# Patient Record
Sex: Female | Born: 1995
Health system: Southern US, Community
[De-identification: ages and names within clinical notes are randomized; demographics above are authoritative.]

## PROBLEM LIST (undated history)

## (undated) DIAGNOSIS — R51 Headache: Secondary | ICD-10-CM

## (undated) DIAGNOSIS — T7840XA Allergy, unspecified, initial encounter: Secondary | ICD-10-CM

## (undated) DIAGNOSIS — L309 Dermatitis, unspecified: Secondary | ICD-10-CM

## (undated) DIAGNOSIS — F319 Bipolar disorder, unspecified: Secondary | ICD-10-CM

## (undated) DIAGNOSIS — J45909 Unspecified asthma, uncomplicated: Secondary | ICD-10-CM

## (undated) HISTORY — DX: Headache: R51

## (undated) HISTORY — DX: Bipolar disorder, unspecified: F31.9

## (undated) HISTORY — DX: Allergy, unspecified, initial encounter: T78.40XA

## (undated) HISTORY — DX: Unspecified asthma, uncomplicated: J45.909

## (undated) HISTORY — PX: WISDOM TOOTH EXTRACTION: SHX21

## (undated) HISTORY — DX: Dermatitis, unspecified: L30.9

---
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
## 2017-01-01 ENCOUNTER — Ambulatory Visit (INDEPENDENT_AMBULATORY_CARE_PROVIDER_SITE_OTHER): Payer: 59

## 2017-01-01 DIAGNOSIS — J309 Allergic rhinitis, unspecified: Secondary | ICD-10-CM

## 2017-01-02 ENCOUNTER — Encounter: Payer: Self-pay | Admitting: *Deleted

## 2017-01-03 ENCOUNTER — Telehealth: Payer: Self-pay | Admitting: *Deleted

## 2017-01-03 NOTE — Telephone Encounter (Signed)
Pt mom called really upset because she got charged from a spiro when they told the doctor that "she didn't want it and now she has a high fee for something that she didn't even want done". shes also upset because for a "15 minute visit that was basically pointless, she now has a bill of $164.68". She would like a call back before 5 o clock today.

## 2017-01-03 NOTE — Telephone Encounter (Signed)
Tried to call mom back, but it was a place of business voicemail so I did not leave a message - will try again tomorrow

## 2017-01-04 MED FILL — LEVOCETIRIZINE 5 MG TABLET: 5 | 30 days supply | Qty: 30 | Fill #1

## 2017-01-04 MED FILL — OLOPATADINE HCL 0.2% EYE DR: 0.2 | 30 days supply | Qty: 3 | Fill #1

## 2017-01-04 NOTE — Telephone Encounter (Signed)
Called Mom & read Johnette's explanation to her - she will call insurance about deductible - she did not think she had a deductible - she still does not think Dr. Nunzio CobbsBobbitt should have done the breathing test but she will pay with [employer] card - kt

## 2017-01-04 NOTE — Telephone Encounter (Signed)
Sent note to Johnette asking the best way to handle this - kt

## 2017-01-07 ENCOUNTER — Ambulatory Visit (INDEPENDENT_AMBULATORY_CARE_PROVIDER_SITE_OTHER): Payer: 59

## 2017-01-07 DIAGNOSIS — J309 Allergic rhinitis, unspecified: Secondary | ICD-10-CM | POA: Diagnosis not present

## 2017-01-10 ENCOUNTER — Ambulatory Visit (INDEPENDENT_AMBULATORY_CARE_PROVIDER_SITE_OTHER): Payer: 59

## 2017-01-10 DIAGNOSIS — J309 Allergic rhinitis, unspecified: Secondary | ICD-10-CM | POA: Diagnosis not present

## 2017-01-14 ENCOUNTER — Ambulatory Visit (INDEPENDENT_AMBULATORY_CARE_PROVIDER_SITE_OTHER): Payer: 59

## 2017-01-14 DIAGNOSIS — J309 Allergic rhinitis, unspecified: Secondary | ICD-10-CM | POA: Diagnosis not present

## 2017-01-18 ENCOUNTER — Ambulatory Visit (INDEPENDENT_AMBULATORY_CARE_PROVIDER_SITE_OTHER): Payer: 59

## 2017-01-18 DIAGNOSIS — J309 Allergic rhinitis, unspecified: Secondary | ICD-10-CM | POA: Diagnosis not present

## 2017-01-22 ENCOUNTER — Ambulatory Visit (INDEPENDENT_AMBULATORY_CARE_PROVIDER_SITE_OTHER): Payer: 59

## 2017-01-22 DIAGNOSIS — J309 Allergic rhinitis, unspecified: Secondary | ICD-10-CM

## 2017-01-25 ENCOUNTER — Ambulatory Visit (INDEPENDENT_AMBULATORY_CARE_PROVIDER_SITE_OTHER): Payer: 59 | Admitting: *Deleted

## 2017-01-25 DIAGNOSIS — J309 Allergic rhinitis, unspecified: Secondary | ICD-10-CM

## 2017-01-30 ENCOUNTER — Ambulatory Visit (INDEPENDENT_AMBULATORY_CARE_PROVIDER_SITE_OTHER): Payer: 59

## 2017-01-30 DIAGNOSIS — J309 Allergic rhinitis, unspecified: Secondary | ICD-10-CM | POA: Diagnosis not present

## 2017-02-04 ENCOUNTER — Ambulatory Visit (INDEPENDENT_AMBULATORY_CARE_PROVIDER_SITE_OTHER): Payer: 59

## 2017-02-04 DIAGNOSIS — J309 Allergic rhinitis, unspecified: Secondary | ICD-10-CM | POA: Diagnosis not present

## 2017-02-04 MED FILL — LEVOCETIRIZINE 5 MG TABLET: 5 | 30 days supply | Qty: 30 | Fill #2

## 2017-02-13 ENCOUNTER — Ambulatory Visit (INDEPENDENT_AMBULATORY_CARE_PROVIDER_SITE_OTHER): Payer: 59

## 2017-02-13 DIAGNOSIS — J309 Allergic rhinitis, unspecified: Secondary | ICD-10-CM | POA: Diagnosis not present

## 2017-02-18 ENCOUNTER — Ambulatory Visit (INDEPENDENT_AMBULATORY_CARE_PROVIDER_SITE_OTHER): Payer: 59 | Admitting: *Deleted

## 2017-02-18 DIAGNOSIS — J309 Allergic rhinitis, unspecified: Secondary | ICD-10-CM

## 2017-02-21 ENCOUNTER — Ambulatory Visit (INDEPENDENT_AMBULATORY_CARE_PROVIDER_SITE_OTHER): Payer: 59 | Admitting: *Deleted

## 2017-02-21 DIAGNOSIS — J309 Allergic rhinitis, unspecified: Secondary | ICD-10-CM

## 2017-02-26 ENCOUNTER — Ambulatory Visit (INDEPENDENT_AMBULATORY_CARE_PROVIDER_SITE_OTHER): Payer: 59 | Admitting: *Deleted

## 2017-02-26 DIAGNOSIS — J309 Allergic rhinitis, unspecified: Secondary | ICD-10-CM | POA: Diagnosis not present

## 2017-03-07 ENCOUNTER — Ambulatory Visit (INDEPENDENT_AMBULATORY_CARE_PROVIDER_SITE_OTHER): Payer: 59

## 2017-03-07 DIAGNOSIS — J309 Allergic rhinitis, unspecified: Secondary | ICD-10-CM | POA: Diagnosis not present

## 2017-03-14 ENCOUNTER — Ambulatory Visit (INDEPENDENT_AMBULATORY_CARE_PROVIDER_SITE_OTHER): Payer: 59

## 2017-03-14 DIAGNOSIS — J309 Allergic rhinitis, unspecified: Secondary | ICD-10-CM | POA: Diagnosis not present

## 2017-03-19 ENCOUNTER — Ambulatory Visit (INDEPENDENT_AMBULATORY_CARE_PROVIDER_SITE_OTHER): Payer: 59 | Admitting: *Deleted

## 2017-03-19 DIAGNOSIS — J309 Allergic rhinitis, unspecified: Secondary | ICD-10-CM | POA: Diagnosis not present

## 2017-03-21 MED FILL — OLOPATADINE HCL 0.2 % SOLN: 0.2 | 30 days supply | Qty: 3 | Fill #2

## 2017-03-21 MED FILL — LEVOCETIRIZINE 5 MG TABLET: 5 | 30 days supply | Qty: 30 | Fill #3

## 2017-03-22 ENCOUNTER — Ambulatory Visit (INDEPENDENT_AMBULATORY_CARE_PROVIDER_SITE_OTHER): Payer: 59

## 2017-03-22 DIAGNOSIS — J309 Allergic rhinitis, unspecified: Secondary | ICD-10-CM

## 2017-03-28 ENCOUNTER — Ambulatory Visit (INDEPENDENT_AMBULATORY_CARE_PROVIDER_SITE_OTHER): Payer: 59

## 2017-03-28 DIAGNOSIS — J309 Allergic rhinitis, unspecified: Secondary | ICD-10-CM

## 2017-04-02 ENCOUNTER — Ambulatory Visit (INDEPENDENT_AMBULATORY_CARE_PROVIDER_SITE_OTHER): Payer: 59

## 2017-04-02 DIAGNOSIS — J309 Allergic rhinitis, unspecified: Secondary | ICD-10-CM

## 2017-04-10 ENCOUNTER — Ambulatory Visit (INDEPENDENT_AMBULATORY_CARE_PROVIDER_SITE_OTHER): Payer: 59

## 2017-04-10 DIAGNOSIS — J309 Allergic rhinitis, unspecified: Secondary | ICD-10-CM | POA: Diagnosis not present

## 2017-04-18 ENCOUNTER — Ambulatory Visit (INDEPENDENT_AMBULATORY_CARE_PROVIDER_SITE_OTHER): Payer: 59

## 2017-04-18 DIAGNOSIS — J309 Allergic rhinitis, unspecified: Secondary | ICD-10-CM | POA: Diagnosis not present

## 2017-04-23 ENCOUNTER — Ambulatory Visit (INDEPENDENT_AMBULATORY_CARE_PROVIDER_SITE_OTHER): Payer: 59

## 2017-04-23 DIAGNOSIS — J309 Allergic rhinitis, unspecified: Secondary | ICD-10-CM

## 2017-04-26 ENCOUNTER — Ambulatory Visit (INDEPENDENT_AMBULATORY_CARE_PROVIDER_SITE_OTHER): Payer: 59 | Admitting: *Deleted

## 2017-04-26 DIAGNOSIS — J309 Allergic rhinitis, unspecified: Secondary | ICD-10-CM | POA: Diagnosis not present

## 2017-04-30 ENCOUNTER — Ambulatory Visit (INDEPENDENT_AMBULATORY_CARE_PROVIDER_SITE_OTHER): Payer: 59

## 2017-04-30 DIAGNOSIS — J309 Allergic rhinitis, unspecified: Secondary | ICD-10-CM | POA: Diagnosis not present

## 2017-05-03 MED FILL — LEVOCETIRIZINE 5 MG TABLET: 5 | 30 days supply | Qty: 30 | Fill #4

## 2017-05-06 ENCOUNTER — Ambulatory Visit (INDEPENDENT_AMBULATORY_CARE_PROVIDER_SITE_OTHER): Payer: 59

## 2017-05-06 DIAGNOSIS — J309 Allergic rhinitis, unspecified: Secondary | ICD-10-CM

## 2017-05-09 ENCOUNTER — Ambulatory Visit (INDEPENDENT_AMBULATORY_CARE_PROVIDER_SITE_OTHER): Payer: 59

## 2017-05-09 DIAGNOSIS — J309 Allergic rhinitis, unspecified: Secondary | ICD-10-CM | POA: Diagnosis not present

## 2017-05-14 ENCOUNTER — Ambulatory Visit (INDEPENDENT_AMBULATORY_CARE_PROVIDER_SITE_OTHER): Payer: 59

## 2017-05-14 DIAGNOSIS — J309 Allergic rhinitis, unspecified: Secondary | ICD-10-CM

## 2017-05-17 ENCOUNTER — Ambulatory Visit (INDEPENDENT_AMBULATORY_CARE_PROVIDER_SITE_OTHER): Payer: 59

## 2017-05-17 DIAGNOSIS — J309 Allergic rhinitis, unspecified: Secondary | ICD-10-CM

## 2017-05-21 ENCOUNTER — Ambulatory Visit (INDEPENDENT_AMBULATORY_CARE_PROVIDER_SITE_OTHER): Payer: 59

## 2017-05-21 DIAGNOSIS — J309 Allergic rhinitis, unspecified: Secondary | ICD-10-CM | POA: Diagnosis not present

## 2017-05-24 ENCOUNTER — Ambulatory Visit (INDEPENDENT_AMBULATORY_CARE_PROVIDER_SITE_OTHER): Payer: 59

## 2017-05-24 DIAGNOSIS — J309 Allergic rhinitis, unspecified: Secondary | ICD-10-CM

## 2017-05-30 ENCOUNTER — Ambulatory Visit (INDEPENDENT_AMBULATORY_CARE_PROVIDER_SITE_OTHER): Payer: 59

## 2017-05-30 DIAGNOSIS — J309 Allergic rhinitis, unspecified: Secondary | ICD-10-CM | POA: Diagnosis not present

## 2017-06-11 ENCOUNTER — Ambulatory Visit (INDEPENDENT_AMBULATORY_CARE_PROVIDER_SITE_OTHER): Payer: 59

## 2017-06-11 DIAGNOSIS — J309 Allergic rhinitis, unspecified: Secondary | ICD-10-CM

## 2017-06-13 MED FILL — LEVOCETIRIZINE 5 MG TABLET: 5 | 30 days supply | Qty: 30 | Fill #5

## 2017-06-20 ENCOUNTER — Ambulatory Visit (INDEPENDENT_AMBULATORY_CARE_PROVIDER_SITE_OTHER): Payer: 59

## 2017-06-20 DIAGNOSIS — J309 Allergic rhinitis, unspecified: Secondary | ICD-10-CM

## 2017-07-01 ENCOUNTER — Other Ambulatory Visit: Payer: Self-pay

## 2017-07-01 ENCOUNTER — Ambulatory Visit (INDEPENDENT_AMBULATORY_CARE_PROVIDER_SITE_OTHER): Payer: 59

## 2017-07-01 DIAGNOSIS — J302 Other seasonal allergic rhinitis: Secondary | ICD-10-CM

## 2017-07-01 DIAGNOSIS — J309 Allergic rhinitis, unspecified: Secondary | ICD-10-CM | POA: Diagnosis not present

## 2017-07-01 DIAGNOSIS — H1010 Acute atopic conjunctivitis, unspecified eye: Secondary | ICD-10-CM

## 2017-07-01 MED ORDER — LEVOCETIRIZINE DIHYDROCHLORIDE 5 MG PO TABS: 5.0000 mg | ORAL_TABLET | Freq: Every evening | ORAL | 0 refills | Status: DC

## 2017-07-01 NOTE — Telephone Encounter (Signed)
RF on Xyzal x 1 with no refills, pt needs an OV soon

## 2017-07-11 ENCOUNTER — Ambulatory Visit (INDEPENDENT_AMBULATORY_CARE_PROVIDER_SITE_OTHER): Payer: 59

## 2017-07-11 DIAGNOSIS — J309 Allergic rhinitis, unspecified: Secondary | ICD-10-CM | POA: Diagnosis not present

## 2017-07-16 MED FILL — LEVOCETIRIZINE 5 MG TABLET: 5 | 30 days supply | Qty: 30 | Fill #0

## 2017-07-25 ENCOUNTER — Ambulatory Visit (INDEPENDENT_AMBULATORY_CARE_PROVIDER_SITE_OTHER): Payer: 59

## 2017-07-25 DIAGNOSIS — J309 Allergic rhinitis, unspecified: Secondary | ICD-10-CM

## 2017-08-01 ENCOUNTER — Ambulatory Visit (INDEPENDENT_AMBULATORY_CARE_PROVIDER_SITE_OTHER): Payer: 59

## 2017-08-01 DIAGNOSIS — J309 Allergic rhinitis, unspecified: Secondary | ICD-10-CM | POA: Diagnosis not present

## 2017-08-08 ENCOUNTER — Ambulatory Visit (INDEPENDENT_AMBULATORY_CARE_PROVIDER_SITE_OTHER): Payer: 59

## 2017-08-08 DIAGNOSIS — J309 Allergic rhinitis, unspecified: Secondary | ICD-10-CM | POA: Diagnosis not present

## 2017-08-12 ENCOUNTER — Ambulatory Visit: Payer: 59 | Admitting: Family Medicine

## 2017-08-21 ENCOUNTER — Ambulatory Visit (INDEPENDENT_AMBULATORY_CARE_PROVIDER_SITE_OTHER): Payer: 59

## 2017-08-21 DIAGNOSIS — J309 Allergic rhinitis, unspecified: Secondary | ICD-10-CM | POA: Diagnosis not present

## 2017-09-02 ENCOUNTER — Ambulatory Visit (INDEPENDENT_AMBULATORY_CARE_PROVIDER_SITE_OTHER): Payer: 59

## 2017-09-02 DIAGNOSIS — J309 Allergic rhinitis, unspecified: Secondary | ICD-10-CM

## 2017-09-10 ENCOUNTER — Ambulatory Visit (INDEPENDENT_AMBULATORY_CARE_PROVIDER_SITE_OTHER): Payer: 59

## 2017-09-10 DIAGNOSIS — H5211 Myopia, right eye: Secondary | ICD-10-CM | POA: Diagnosis not present

## 2017-09-10 DIAGNOSIS — J309 Allergic rhinitis, unspecified: Secondary | ICD-10-CM

## 2017-09-11 DIAGNOSIS — J3089 Other allergic rhinitis: Secondary | ICD-10-CM | POA: Diagnosis not present

## 2017-09-11 NOTE — Progress Notes (Signed)
VIAL EXP 09-11-18 

## 2017-09-13 ENCOUNTER — Telehealth: Payer: Self-pay | Admitting: Family Medicine

## 2017-09-13 NOTE — Telephone Encounter (Signed)
Her original appt. date was on 08/12/17 but got rescheduled to 11/14/17. Dr. Patsy Lageropland is not here on that day so I called to reschedule not knowing it had already been rescheduled once before so her mother wanted me to ask  Dr. Patsy Lageropland if she could get a date sooner than May since she has been trying to have an appt since December?

## 2017-09-13 NOTE — Telephone Encounter (Signed)
Please advise 

## 2017-09-13 NOTE — Telephone Encounter (Signed)
Sure, you can put her in wherever.  She is a new patient but that is ok, you can schedule her for a 30 minute any time that she likes

## 2017-09-16 NOTE — Telephone Encounter (Signed)
Called pt and she will come in Feb 4th at 1:15pm.

## 2017-09-23 ENCOUNTER — Encounter: Payer: Self-pay | Admitting: Pediatrics

## 2017-09-23 ENCOUNTER — Ambulatory Visit: Payer: 59 | Admitting: Pediatrics

## 2017-09-23 VITALS — BP 100/60 | HR 102 | Temp 97.7°F | Resp 24 | Ht 62.5 in | Wt 139.0 lb

## 2017-09-23 DIAGNOSIS — J309 Allergic rhinitis, unspecified: Secondary | ICD-10-CM | POA: Diagnosis not present

## 2017-09-23 DIAGNOSIS — J4520 Mild intermittent asthma, uncomplicated: Secondary | ICD-10-CM | POA: Insufficient documentation

## 2017-09-23 DIAGNOSIS — J302 Other seasonal allergic rhinitis: Secondary | ICD-10-CM

## 2017-09-23 DIAGNOSIS — H1010 Acute atopic conjunctivitis, unspecified eye: Secondary | ICD-10-CM | POA: Diagnosis not present

## 2017-09-23 DIAGNOSIS — L503 Dermatographic urticaria: Secondary | ICD-10-CM | POA: Diagnosis not present

## 2017-09-23 DIAGNOSIS — T7800XD Anaphylactic reaction due to unspecified food, subsequent encounter: Secondary | ICD-10-CM | POA: Diagnosis not present

## 2017-09-23 MED ORDER — RANITIDINE HCL 150 MG PO TABS: 150.0000 mg | ORAL_TABLET | Freq: Two times a day (BID) | ORAL | 5 refills | Status: DC

## 2017-09-23 MED ORDER — MONTELUKAST SODIUM 10 MG PO TABS: 10.0000 mg | ORAL_TABLET | Freq: Every day | ORAL | 5 refills | Status: DC

## 2017-09-23 MED FILL — raNITIdine HCL 150 MG TABS: 150 | 30 days supply | Qty: 60 | Fill #0

## 2017-09-23 MED FILL — MONTELUKAST SOD 10 MG TAB: 10 | 30 days supply | Qty: 30 | Fill #0

## 2017-09-23 NOTE — Progress Notes (Signed)
[address] [place] [address] Dept: [phone]  FOLLOW UP NOTE  Patient ID: Alexis SkyeMyla A Dixon, female    DOB: Jun 18, 1996  Age: 22 y.o. MRN: [id number] Date of Office Visit: 09/23/2017  Assessment  Chief Complaint: Urticaria and Allergies  HPI Alexis Dixon presents for follow-up of allergic rhinitis, chronic urticaria and asthma. Her asthma is well controlled. She has been itching daily for about 6 months. She is tolerating her allergy injections well. She is on levocetirizine 5 mg once a day and Proventil 2 puffs every 4 hours if needed. She continues to avoid tree nuts  Current medications will be outlined in the after visit summary   Drug Allergies:  Allergies  Allergen Reactions  . Other Swelling    Tree Nuts     Physical Exam: BP 100/60   Pulse (!) 102   Temp 97.7 F (36.5 C) (Oral)   Resp (!) 24   Ht 5' 2.5" (1.588 m)   Wt 139 lb (63 kg)   SpO2 98%   BMI 25.02 kg/m    Physical Exam  Constitutional: She is oriented to person, place, and time. She appears well-developed and well-nourished.  HENT:  Eyes normal. Ears normal. Nose normal. Pharynx normal.  Neck: Neck supple. No thyromegaly present.  Cardiovascular:  S1 and S2 normal no murmurs  Pulmonary/Chest:  Clear to percussion and auscultation  Lymphadenopathy:    She has no cervical adenopathy.  Neurological: She is alert and oriented to person, place, and time.  Skin:  Clear but she had dermographia noted  Psychiatric: She has a normal mood and affect. Her behavior is normal. Judgment and thought content normal.  Vitals reviewed.   Diagnostics:  FVC 3.49 L FEV1 3.01 L predicted FVC 3.06 L predicted FEV1 2.74 L the spirometry is in the normal range  Assessment and Plan: 1. Mild intermittent asthma without complication   2. Seasonal allergic conjunctivitis   3. Other seasonal allergic rhinitis   4. Dermographia   5. Anaphylactic shock due to food, subsequent encounter     Meds ordered  this encounter  Medications  . montelukast (SINGULAIR) 10 MG tablet    Sig: Take 1 tablet (10 mg total) by mouth at bedtime.    Dispense:  30 tablet    Refill:  5  . ranitidine (ZANTAC) 150 MG tablet    Sig: Take 1 tablet (150 mg total) by mouth 2 (two) times daily.    Dispense:  60 tablet    Refill:  5    Patient Instructions  Zyrtec 10 mg-take 1 tablet twice a day for itching Ranitidine 150 mg-take 1 tablet twice a day. It may help itching Montelukast 10 mg-take 1 tablet once a day for coughing or wheezing. It may help itching Proventil 2 puffs every 4 hours if needed for wheezing or coughing spells Fluticasone 2 sprays per nostril once a day if needed for stuffy nose Add prednisone 10 mg twice a day for 4 days, 10 mg on the fifth day Call me if you are not doing well on this treatment Do foods with salicylates make you itch? Continue on your allergy injections  Avoid tree nuts. If you have an allergic reaction take Benadryl 50 mg  every 4 hours and if you have life-threatening symptoms inject  with EpiPen 0.3 mg   Return in about 4 weeks (around 10/21/2017).    Thank you for the opportunity to care for this patient.  Please do not hesitate to contact  me with questions.  Penne Lash, M.D.  [facility] [address] [place], [place] [address] [phone]

## 2017-09-23 NOTE — Patient Instructions (Addendum)
Zyrtec 10 mg-take 1 tablet twice a day for itching Ranitidine 150 mg-take 1 tablet twice a day. It may help itching Montelukast 10 mg-take 1 tablet once a day for coughing or wheezing. It may help itching Proventil 2 puffs every 4 hours if needed for wheezing or coughing spells Fluticasone 2 sprays per nostril once a day if needed for stuffy nose Add prednisone 10 mg twice a day for 4 days, 10 mg on the fifth day Call me if you are not doing well on this treatment Do foods with salicylates make you itch? Continue on your allergy injections  Avoid tree nuts. If you have an allergic reaction take Benadryl 50 mg  every 4 hours and if you have life-threatening symptoms inject  with EpiPen 0.3 mg

## 2017-09-24 ENCOUNTER — Other Ambulatory Visit: Payer: Self-pay | Admitting: Allergy and Immunology

## 2017-09-24 DIAGNOSIS — J302 Other seasonal allergic rhinitis: Secondary | ICD-10-CM

## 2017-09-24 DIAGNOSIS — H1010 Acute atopic conjunctivitis, unspecified eye: Secondary | ICD-10-CM

## 2017-09-24 MED FILL — LEVOCETIRIZINE 5 MG TABLET: 5 | 30 days supply | Qty: 30 | Fill #0

## 2017-09-28 NOTE — Progress Notes (Signed)
[facility] [address] [place], [place] [address] [phone] [phone]  Date:  09/30/2017   Name:  Alexis SkyeMyla A Dixon   DOB:  11-Nov-1995   MRN:  [id number]  PCP:  Pearline Cablesopland, Jessica C, MD    Chief Complaint: Establish Care (Pt here to est care. )   History of Present Illness:  Alexis Dixon is a 22 y.o. very pleasant female patient who presents with the following:  New patient here today to establish care She is getting allergy shots- still.  She has a lot of environmental allergies  Saw Dr. Nilda SimmerKristi Smith a couple of year ago  She also does have asthma, but just uses albuterol prn She mostly just needs this when she is around tobacco  She is a Consulting civil engineerstudent at [employer], in the nursing program.  She also works at [employer] She is from [place], but her parents moved to this area years ago  In her free time she enjoys reading and video games Never had nay serious illness, never hospitalized  She lives at home with her mom and dad   We will do a flu shot for her today She is s/p all 3 gardasil She does not smoke, drink or use any drugs She is not SA right now   Her only concern is migraine headaches- these are not new, but are becoming more frequent/  She was on amitriptyline for a while, and does have imitrex as well She will get photosensitivity Nausea, but no vomiting She does get occasional aura  She is not using amitriptyline right now She has had migraines since puberty   She is on flonase, xyzal, singulair  She declines labs today Menses are regular  Her mother has noted a skin change on her right cheek for the last 3-4 years.  Pt is not really worried about this, but mentions it as her mother had wanted her to    Patient Active Problem List   Diagnosis Date Noted  . Mild intermittent asthma without complication 09/23/2017  . Dermographia 09/23/2017  . Wheezing 11/29/2016  . Other seasonal allergic  rhinitis 01/24/2016  . Anaphylactic shock due to adverse food reaction 01/24/2016  . Seasonal allergic conjunctivitis 01/24/2016  . Headache(784.0) 07/05/2013    Past Medical History:  Diagnosis Date  . Allergy    Tree Nuts ; Claritin D  . Asthma    childhood; Albuterol and Singulair; no hospitalizations and ED visits.  . Eczema   . GLOVFIEP(329.5Headache(784.0)     Past Surgical History:  Procedure Laterality Date  . WISDOM TOOTH EXTRACTION      Social History   Tobacco Use  . Smoking status: Never Smoker  . Smokeless tobacco: Never Used  Substance Use Topics  . Alcohol use: No  . Drug use: No    Family History  Problem Relation Age of Onset  . Diabetes Mother   . Hyperlipidemia Maternal Grandmother   . Hypertension Maternal Grandmother   . Diabetes Maternal Grandmother   . Asthma Paternal Grandmother   . Asthma Sister   . Eczema Maternal Grandfather     Allergies  Allergen Reactions  . Other Swelling    Tree Nuts     Medication list has been reviewed and updated.  Current Outpatient Medications on File Prior to Visit  Medication Sig Dispense Refill  . albuterol (PROVENTIL HFA;VENTOLIN HFA) 108 (90 Base) MCG/ACT inhaler Inhale 2 puffs into the lungs  every 6 (six) hours as needed for wheezing or shortness of breath. Reported on 08/16/2015 18 g 1  . azelastine (OPTIVAR) 0.05 % ophthalmic solution Place 1 drop into both eyes 2 (two) times daily. 6 mL 12  . EPINEPHrine (EPIPEN 2-PAK) 0.3 mg/0.3 mL IJ SOAJ injection Inject 0.3 mLs (0.3 mg total) into the muscle once. 1 Device 1  . fluticasone (FLONASE) 50 MCG/ACT nasal spray Place 2 sprays into both nostrils daily. For at least 1 week 16 g 1  . levocetirizine (XYZAL) 5 MG tablet TAKE 1 TABLET (5 MG TOTAL) BY MOUTH EVERY EVENING. *NEED OFFICE VISIT FOR ADDITIONAL REFILLS PER MD* 30 tablet 1  . montelukast (SINGULAIR) 10 MG tablet Take 1 tablet (10 mg total) by mouth at bedtime. 30 tablet 5  . olopatadine (PATANOL) 0.1 %  ophthalmic solution Place 1 drop into both eyes 2 (two) times daily. 5 mL 0  . Olopatadine HCl (PAZEO) 0.7 % SOLN Place 1 drop into both eyes 1 day or 1 dose. 1 Bottle 5  . ranitidine (ZANTAC) 150 MG tablet Take 1 tablet (150 mg total) by mouth 2 (two) times daily. 60 tablet 5   No current facility-administered medications on file prior to visit.     Review of Systems:  As per HPI- otherwise negative. No fever or chills Feeling generally well Does exercise by going to the gym 1 or 2x a week     Physical Examination: Vitals:   09/30/17 1338  BP: 110/72  Pulse: 94  Temp: 98.1 F (36.7 C)  SpO2: 99%   Vitals:   09/30/17 1338  Weight: 140 lb 12.8 oz (63.9 kg)  Height: 5' 2.5" (1.588 m)   Body mass index is 25.34 kg/m. Ideal Body Weight: Weight in (lb) to have BMI = 25: 138.6  GEN: WDWN, NAD, Non-toxic, A & O x 3, normal weight, looks well and healthy  HEENT: Atraumatic, Normocephalic. Neck supple. No masses, No LAD.  Bilateral TM wnl, oropharynx normal.  PEERL,EOMI.   Right cheek- there is a quarter sized patch of mild hyperpigmentation and texture change Ears and Nose: No external deformity. CV: RRR, No M/G/R. No JVD. No thrill. No extra heart sounds. PULM: CTA B, no wheezes, crackles, rhonchi. No retractions. No resp. distress. No accessory muscle use. ABD: S, NT, ND EXTR: No c/c/e NEURO Normal gait.  PSYCH: Normally interactive. Conversant. Not depressed or anxious appearing.  Calm demeanor.    Assessment and Plan: Migraine with aura and without status migrainosus, not intractable - Plan: SUMAtriptan (IMITREX) 100 MG tablet  Immunization due - Plan: Flu Vaccine QUAD 36+ mos IM (Fluarix & Fluzone Quad PF  Establishing care with new doctor, encounter for  Skin lesion - Plan: Ambulatory referral to Dermatology  Here today to establish care Flu shot today Refilled her imitrex- for the time being she does not wish to go on any other controller medication but will  let me know if she changes her mind  Referral to derm for the spt on her cheek  See patient instructions for more details.     Signed Abbe Amsterdam, MD

## 2017-09-30 ENCOUNTER — Ambulatory Visit: Payer: 59 | Admitting: Family Medicine

## 2017-09-30 ENCOUNTER — Encounter: Payer: Self-pay | Admitting: Family Medicine

## 2017-09-30 VITALS — BP 110/72 | HR 94 | Temp 98.1°F | Ht 62.5 in | Wt 140.8 lb

## 2017-09-30 DIAGNOSIS — Z23 Encounter for immunization: Secondary | ICD-10-CM

## 2017-09-30 DIAGNOSIS — Z7689 Persons encountering health services in other specified circumstances: Secondary | ICD-10-CM

## 2017-09-30 DIAGNOSIS — L989 Disorder of the skin and subcutaneous tissue, unspecified: Secondary | ICD-10-CM

## 2017-09-30 DIAGNOSIS — G43109 Migraine with aura, not intractable, without status migrainosus: Secondary | ICD-10-CM | POA: Diagnosis not present

## 2017-09-30 MED ORDER — SUMATRIPTAN SUCCINATE 100 MG PO TABS: 100.0000 mg | ORAL_TABLET | ORAL | 5 refills | Status: DC | PRN

## 2017-09-30 MED FILL — SUMATRIPTAN SUCC 100 MG TAB: 100 | 30 days supply | Qty: 8 | Fill #0

## 2017-09-30 NOTE — Patient Instructions (Addendum)
It was great to meet you today!  I refilled your imitrex to use as needed for headache- max of 200 mg per 24 hours. If your migraines continue to be more frequent, let me know and we can try a medication for prevention I am not sure what the mark on your right cheek is- we will have dermatology check this out for you  Assuming all is well, please come and see me in 9- 12 months for a physical

## 2017-10-14 ENCOUNTER — Ambulatory Visit (INDEPENDENT_AMBULATORY_CARE_PROVIDER_SITE_OTHER): Payer: 59

## 2017-10-14 DIAGNOSIS — J309 Allergic rhinitis, unspecified: Secondary | ICD-10-CM

## 2017-10-23 ENCOUNTER — Ambulatory Visit (INDEPENDENT_AMBULATORY_CARE_PROVIDER_SITE_OTHER): Payer: 59

## 2017-10-23 DIAGNOSIS — J309 Allergic rhinitis, unspecified: Secondary | ICD-10-CM | POA: Diagnosis not present

## 2017-11-01 ENCOUNTER — Ambulatory Visit (INDEPENDENT_AMBULATORY_CARE_PROVIDER_SITE_OTHER): Payer: 59

## 2017-11-01 DIAGNOSIS — J309 Allergic rhinitis, unspecified: Secondary | ICD-10-CM

## 2017-11-04 DIAGNOSIS — L810 Postinflammatory hyperpigmentation: Secondary | ICD-10-CM | POA: Diagnosis not present

## 2017-11-04 DIAGNOSIS — L700 Acne vulgaris: Secondary | ICD-10-CM | POA: Diagnosis not present

## 2017-11-06 MED FILL — MONTELUKAST SOD 10 MG TAB: 10 | 30 days supply | Qty: 30 | Fill #1

## 2017-11-06 MED FILL — LEVOCETIRIZINE 5 MG TABLET: 5 | 30 days supply | Qty: 30 | Fill #1

## 2017-11-06 MED FILL — raNITIdine HCL 150 MG TABS: 150 | 30 days supply | Qty: 60 | Fill #1

## 2017-11-07 ENCOUNTER — Ambulatory Visit (INDEPENDENT_AMBULATORY_CARE_PROVIDER_SITE_OTHER): Payer: 59 | Admitting: *Deleted

## 2017-11-07 DIAGNOSIS — J309 Allergic rhinitis, unspecified: Secondary | ICD-10-CM

## 2017-11-14 ENCOUNTER — Ambulatory Visit: Payer: 59 | Admitting: Family Medicine

## 2017-11-15 ENCOUNTER — Ambulatory Visit (INDEPENDENT_AMBULATORY_CARE_PROVIDER_SITE_OTHER): Payer: 59 | Admitting: *Deleted

## 2017-11-15 DIAGNOSIS — J309 Allergic rhinitis, unspecified: Secondary | ICD-10-CM

## 2017-11-19 ENCOUNTER — Ambulatory Visit (INDEPENDENT_AMBULATORY_CARE_PROVIDER_SITE_OTHER): Payer: 59

## 2017-11-19 DIAGNOSIS — J309 Allergic rhinitis, unspecified: Secondary | ICD-10-CM | POA: Diagnosis not present

## 2017-11-27 ENCOUNTER — Ambulatory Visit (INDEPENDENT_AMBULATORY_CARE_PROVIDER_SITE_OTHER): Payer: 59 | Admitting: *Deleted

## 2017-11-27 DIAGNOSIS — J309 Allergic rhinitis, unspecified: Secondary | ICD-10-CM

## 2017-12-02 ENCOUNTER — Ambulatory Visit (INDEPENDENT_AMBULATORY_CARE_PROVIDER_SITE_OTHER): Payer: 59

## 2017-12-02 DIAGNOSIS — J309 Allergic rhinitis, unspecified: Secondary | ICD-10-CM

## 2017-12-09 DIAGNOSIS — J3089 Other allergic rhinitis: Secondary | ICD-10-CM | POA: Diagnosis not present

## 2017-12-09 NOTE — Progress Notes (Signed)
Vials exp 12-10-18 

## 2017-12-10 ENCOUNTER — Ambulatory Visit (INDEPENDENT_AMBULATORY_CARE_PROVIDER_SITE_OTHER): Payer: 59

## 2017-12-10 DIAGNOSIS — J309 Allergic rhinitis, unspecified: Secondary | ICD-10-CM | POA: Diagnosis not present

## 2017-12-17 ENCOUNTER — Ambulatory Visit (INDEPENDENT_AMBULATORY_CARE_PROVIDER_SITE_OTHER): Payer: 59

## 2017-12-17 DIAGNOSIS — J309 Allergic rhinitis, unspecified: Secondary | ICD-10-CM

## 2017-12-18 ENCOUNTER — Other Ambulatory Visit: Payer: Self-pay | Admitting: Allergy and Immunology

## 2017-12-18 DIAGNOSIS — J302 Other seasonal allergic rhinitis: Secondary | ICD-10-CM

## 2017-12-18 DIAGNOSIS — H1010 Acute atopic conjunctivitis, unspecified eye: Secondary | ICD-10-CM

## 2017-12-18 MED FILL — LEVOCETIRIZINE 5 MG TABLET: 5 | 30 days supply | Qty: 30 | Fill #0

## 2017-12-18 MED FILL — MONTELUKAST SOD 10 MG TAB: 10 | 30 days supply | Qty: 30 | Fill #2

## 2017-12-18 MED FILL — raNITIdine HCL 150 MG TABS: 150 | 30 days supply | Qty: 60 | Fill #2

## 2017-12-25 ENCOUNTER — Ambulatory Visit (INDEPENDENT_AMBULATORY_CARE_PROVIDER_SITE_OTHER): Payer: 59

## 2017-12-25 DIAGNOSIS — J309 Allergic rhinitis, unspecified: Secondary | ICD-10-CM | POA: Diagnosis not present

## 2018-01-02 ENCOUNTER — Ambulatory Visit (INDEPENDENT_AMBULATORY_CARE_PROVIDER_SITE_OTHER): Payer: 59

## 2018-01-02 DIAGNOSIS — J309 Allergic rhinitis, unspecified: Secondary | ICD-10-CM

## 2018-01-07 ENCOUNTER — Ambulatory Visit (INDEPENDENT_AMBULATORY_CARE_PROVIDER_SITE_OTHER): Payer: 59

## 2018-01-07 DIAGNOSIS — J309 Allergic rhinitis, unspecified: Secondary | ICD-10-CM | POA: Diagnosis not present

## 2018-01-10 ENCOUNTER — Encounter: Payer: Self-pay | Admitting: Family Medicine

## 2018-01-15 ENCOUNTER — Encounter: Payer: Self-pay | Admitting: Family Medicine

## 2018-01-17 ENCOUNTER — Ambulatory Visit (INDEPENDENT_AMBULATORY_CARE_PROVIDER_SITE_OTHER): Payer: 59 | Admitting: *Deleted

## 2018-01-17 ENCOUNTER — Other Ambulatory Visit: Payer: Self-pay | Admitting: Allergy and Immunology

## 2018-01-17 DIAGNOSIS — J302 Other seasonal allergic rhinitis: Secondary | ICD-10-CM

## 2018-01-17 DIAGNOSIS — H1010 Acute atopic conjunctivitis, unspecified eye: Secondary | ICD-10-CM

## 2018-01-17 DIAGNOSIS — J309 Allergic rhinitis, unspecified: Secondary | ICD-10-CM | POA: Diagnosis not present

## 2018-01-17 MED FILL — MONTELUKAST SOD 10 MG TAB: 10 | 30 days supply | Qty: 30 | Fill #3

## 2018-01-17 MED FILL — LEVOCETIRIZINE 5 MG TABLET: 5 | 30 days supply | Qty: 30 | Fill #0

## 2018-01-17 MED FILL — raNITIdine HCL 150 MG TABS: 150 | 30 days supply | Qty: 60 | Fill #3

## 2018-01-21 ENCOUNTER — Ambulatory Visit (INDEPENDENT_AMBULATORY_CARE_PROVIDER_SITE_OTHER): Payer: 59

## 2018-01-21 DIAGNOSIS — J309 Allergic rhinitis, unspecified: Secondary | ICD-10-CM

## 2018-01-24 ENCOUNTER — Telehealth: Payer: Self-pay | Admitting: Family Medicine

## 2018-01-24 NOTE — Telephone Encounter (Signed)
Copied from CRM [id number]. Topic: Quick Communication - See Telephone Encounter >> Jan 24, 2018  2:40 PM Valentina Lucks wrote: CRM for notification. See Telephone encounter for: 01/24/18.  Pt dropped off document to be filled out by provider (1 page - [facility] Science Physical Examination). Pt has an appt on Monday with provider 01-27-2018 and will like to be called or to be informed on Monday about document. Document put at front office tray under Providers name.

## 2018-01-25 NOTE — Progress Notes (Addendum)
[facility] [address], [address] [place], [place] [address] [phone]  Date:  01/27/2018   Name:  Alexis Dixon   DOB:  11/16/1995   MRN:  [id number]  PCP:  Pearline Cables, MD    Chief Complaint: Immunizations (immunization and lab for school, tblood  test)   History of Present Illness:  Alexis Dixon is a 22 y.o. very pleasant female patient who presents with the following:  Here today to go over shots and labs for school- CPE She is undergoing immunotherapy for her allergies - she feels like this is really helping  She is otherwise generally in good health   She is out of school for the summer and plans to catch up on her rest  Wheezing and asthma are not big issuses right now thanks to her immunotherapy treatment  She would like to do routine labs today, she is fasting   She is running for exercise, likes to go out 2-3x a week She is a non smoker and does not drink alcohol  She is not SA and does not wish to have any STI screening or any contraception rx today She does not see GYN  She needs a quantiferon gold for school per her report  Patient Active Problem List   Diagnosis Date Noted  . Mild intermittent asthma without complication 09/23/2017  . Dermographia 09/23/2017  . Wheezing 11/29/2016  . Other seasonal allergic rhinitis 01/24/2016  . Anaphylactic shock due to adverse food reaction 01/24/2016  . Seasonal allergic conjunctivitis 01/24/2016  . Headache(784.0) 07/05/2013    Past Medical History:  Diagnosis Date  . Allergy    Tree Nuts ; Claritin D  . Asthma    childhood; Albuterol and Singulair; no hospitalizations and ED visits.  . Eczema   . EXBMWUXL(244.0)     Past Surgical History:  Procedure Laterality Date  . WISDOM TOOTH EXTRACTION      Social History   Tobacco Use  . Smoking status: Never Smoker  . Smokeless tobacco: Never Used  Substance Use Topics  . Alcohol use: No  .  Drug use: No    Family History  Problem Relation Age of Onset  . Diabetes Mother   . Hyperlipidemia Maternal Grandmother   . Hypertension Maternal Grandmother   . Diabetes Maternal Grandmother   . Asthma Paternal Grandmother   . Asthma Sister   . Eczema Maternal Grandfather     Allergies  Allergen Reactions  . Other Swelling    Tree Nuts     Medication list has been reviewed and updated.  Current Outpatient Medications on File Prior to Visit  Medication Sig Dispense Refill  . albuterol (PROVENTIL HFA;VENTOLIN HFA) 108 (90 Base) MCG/ACT inhaler Inhale 2 puffs into the lungs every 6 (six) hours as needed for wheezing or shortness of breath. Reported on 08/16/2015 18 g 1  . azelastine (OPTIVAR) 0.05 % ophthalmic solution Place 1 drop into both eyes 2 (two) times daily. 6 mL 12  . EPINEPHrine (EPIPEN 2-PAK) 0.3 mg/0.3 mL IJ SOAJ injection Inject 0.3 mLs (0.3 mg total) into the muscle once. 1 Device 1  . fluticasone (FLONASE) 50 MCG/ACT nasal spray Place 2 sprays into both nostrils daily. For at least 1 week 16 g 1  . levocetirizine (XYZAL) 5 MG tablet TAKE 1 TABLET BY MOUTH EVERY EVENING.*NEED OFFICE VISIT FOR ADDITIONAL REFILLS PER MD* 30 tablet 0  . montelukast (SINGULAIR) 10 MG  tablet Take 1 tablet (10 mg total) by mouth at bedtime. 30 tablet 5  . olopatadine (PATANOL) 0.1 % ophthalmic solution Place 1 drop into both eyes 2 (two) times daily. 5 mL 0  . Olopatadine HCl (PAZEO) 0.7 % SOLN Place 1 drop into both eyes 1 day or 1 dose. 1 Bottle 5  . ranitidine (ZANTAC) 150 MG tablet Take 1 tablet (150 mg total) by mouth 2 (two) times daily. 60 tablet 5  . SUMAtriptan (IMITREX) 100 MG tablet Take 1 tablet (100 mg total) by mouth every 2 (two) hours as needed for migraine. May repeat in 2 hours if headache persists or recurs. 8 tablet 5   No current facility-administered medications on file prior to visit.     Review of Systems:  As per HPI- otherwise negative. No fever or chills No  CP or SOB No rash No abd pain Menses are normal per her reprot   Physical Examination: Vitals:   01/27/18 0958  BP: 108/80  Pulse: 83  Resp: 16  SpO2: 96%   Vitals:   01/27/18 0958  Weight: 144 lb (65.3 kg)  Height: 5' 2.5" (1.588 m)   Body mass index is 25.92 kg/m. Ideal Body Weight: Weight in (lb) to have BMI = 25: 138.6  GEN: WDWN, NAD, Non-toxic, A & O x 3, looks well, normal weight HEENT: Atraumatic, Normocephalic. Neck supple. No masses, No LAD.  Bilateral TM wnl, oropharynx normal.  PEERL,EOMI.   Ears and Nose: No external deformity. CV: RRR, No M/G/R. No JVD. No thrill. No extra heart sounds. PULM: CTA B, no wheezes, crackles, rhonchi. No retractions. No resp. distress. No accessory muscle use. ABD: S, NT, ND. No rebound. No HSM. EXTR: No c/c/e NEURO Normal gait.  PSYCH: Normally interactive. Conversant. Not depressed or anxious appearing.  Calm demeanor.    Assessment and Plan: Physical exam  Screening for hyperlipidemia - Plan: Lipid panel  Screening for deficiency anemia - Plan: CBC  Screening for diabetes mellitus - Plan: Comprehensive metabolic panel, Hemoglobin A1c  Screening for tuberculosis - Plan: QuantiFERON-TB Gold Plus  CPE today- labs pending as above quantiferon gold as well Her immunizations are all UTD Answered all questions and completed form for her school today  Signed Abbe Amsterdam, MD Received labs 6/5, message to pt Results for orders placed or performed in visit on 01/27/18  CBC  Result Value Ref Range   WBC 3.9 (L) 4.5 - 10.5 K/uL   RBC 4.28 3.87 - 5.11 Mil/uL   Platelets 260.0 150.0 - 400.0 K/uL   Hemoglobin 12.6 12.0 - 15.0 g/dL   HCT 47.8 29.5 - 62.1 %   MCV 89.1 78.0 - 100.0 fl   MCHC 33.1 30.0 - 36.0 g/dL   RDW 30.8 65.7 - 84.6 %  Comprehensive metabolic panel  Result Value Ref Range   Sodium 140 135 - 145 mEq/L   Potassium 3.7 3.5 - 5.1 mEq/L   Chloride 107 96 - 112 mEq/L   CO2 27 19 - 32 mEq/L   Glucose, Bld  92 70 - 99 mg/dL   BUN 12 6 - 23 mg/dL   Creatinine, Ser 9.62 0.40 - 1.20 mg/dL   Total Bilirubin 0.3 0.2 - 1.2 mg/dL   Alkaline Phosphatase 70 39 - 117 U/L   AST 13 0 - 37 U/L   ALT 11 0 - 35 U/L   Total Protein 6.7 6.0 - 8.3 g/dL   Albumin 4.2 3.5 - 5.2 g/dL   Calcium 9.2  8.4 - 10.5 mg/dL   GFR 409.81144.00 >19.14>60.00 mL/min  Hemoglobin A1c  Result Value Ref Range   Hgb A1c MFr Bld 5.4 4.6 - 6.5 %  Lipid panel  Result Value Ref Range   Cholesterol 95 0 - 200 mg/dL   Triglycerides 78.229.0 0.0 - 149.0 mg/dL   HDL 95.6239.90 >13.08>39.00 mg/dL   VLDL 5.8 0.0 - 65.740.0 mg/dL   LDL Cholesterol 49 0 - 99 mg/dL   Total CHOL/HDL Ratio 2    NonHDL 54.95   QuantiFERON-TB Gold Plus  Result Value Ref Range   QuantiFERON-TB Gold Plus NEGATIVE NEGATIVE   NIL 0.04 IU/mL   Mitogen-NIL 7.51 IU/mL   TB1-NIL 0.00 IU/mL   TB2-NIL 0.00 IU/mL     Your TB blood test- the quantiferon gold- is negative as expected.  The rest of your labs look great except your white blood cell count is slightly low.   This number is not low enough to be very concerning, but I would suggest that we repeat this lab for you in 4-6 months. I will order this for you to have done as a blood only  Take care!  addnd 6/10- pts Mom came in for a visit, let me know that pt also needed a refill of her adapalene gel

## 2018-01-27 ENCOUNTER — Encounter: Payer: Self-pay | Admitting: Family Medicine

## 2018-01-27 ENCOUNTER — Ambulatory Visit: Payer: 59 | Admitting: Family Medicine

## 2018-01-27 VITALS — BP 108/80 | HR 83 | Resp 16 | Ht 62.5 in | Wt 144.0 lb

## 2018-01-27 DIAGNOSIS — Z130 Encounter for screening for diseases of the blood and blood-forming organs and certain disorders involving the immune mechanism: Secondary | ICD-10-CM | POA: Diagnosis not present

## 2018-01-27 DIAGNOSIS — Z13220 Encounter for screening for lipoid disorders: Secondary | ICD-10-CM | POA: Diagnosis not present

## 2018-01-27 DIAGNOSIS — Z0000 Encounter for general adult medical examination without abnormal findings: Secondary | ICD-10-CM

## 2018-01-27 DIAGNOSIS — Z111 Encounter for screening for respiratory tuberculosis: Secondary | ICD-10-CM

## 2018-01-27 DIAGNOSIS — D72819 Decreased white blood cell count, unspecified: Secondary | ICD-10-CM

## 2018-01-27 DIAGNOSIS — Z131 Encounter for screening for diabetes mellitus: Secondary | ICD-10-CM | POA: Diagnosis not present

## 2018-01-27 LAB — CBC
HEMATOCRIT: 38.1 % (ref 36.0–46.0)
HEMOGLOBIN: 12.6 g/dL (ref 12.0–15.0)
MCHC: 33.1 g/dL (ref 30.0–36.0)
MCV: 89.1 fl (ref 78.0–100.0)
PLATELETS: 260 10*3/uL (ref 150.0–400.0)
RBC: 4.28 Mil/uL (ref 3.87–5.11)
RDW: 13.5 % (ref 11.5–14.6)
WBC: 3.9 10*3/uL — ABNORMAL LOW (ref 4.5–10.5)

## 2018-01-27 LAB — LIPID PANEL
Cholesterol: 95 mg/dL (ref 0–200)
HDL: 39.9 mg/dL (ref >39.00)
LDL CALC: 49 mg/dL (ref 0–99)
NonHDL: 54.95
TRIGLYCERIDES: 29 mg/dL (ref 0.0–149.0)
Total CHOL/HDL Ratio: 2
VLDL: 5.8 mg/dL (ref 0.0–40.0)

## 2018-01-27 LAB — COMPREHENSIVE METABOLIC PANEL
ALBUMIN: 4.2 g/dL (ref 3.5–5.2)
ALT: 11 U/L (ref 0–35)
AST: 13 U/L (ref 0–37)
Alkaline Phosphatase: 70 U/L (ref 39–117)
BILIRUBIN TOTAL: 0.3 mg/dL (ref 0.2–1.2)
BUN: 12 mg/dL (ref 6–23)
CHLORIDE: 107 meq/L (ref 96–112)
CO2: 27 meq/L (ref 19–32)
Calcium: 9.2 mg/dL (ref 8.4–10.5)
Creatinine, Ser: 0.67 mg/dL (ref 0.40–1.20)
GFR: 144 mL/min (ref >60.00)
Glucose, Bld: 92 mg/dL (ref 70–99)
Potassium: 3.7 mEq/L (ref 3.5–5.1)
Sodium: 140 mEq/L (ref 135–145)
Total Protein: 6.7 g/dL (ref 6.0–8.3)

## 2018-01-27 LAB — HEMOGLOBIN A1C: HEMOGLOBIN A1C: 5.4 % (ref 4.6–6.5)

## 2018-01-27 NOTE — Telephone Encounter (Signed)
Completed as much as possible; forwarded to provider/SLS 06/03

## 2018-01-27 NOTE — Patient Instructions (Addendum)
It was great to see you today- take care and best of luck with your next year of school!    I'll be in touch with your labs asap   Health Maintenance, Female Adopting a healthy lifestyle and getting preventive care can go a long way to promote health and wellness. Talk with your health care provider about what schedule of regular examinations is right for you. This is a good chance for you to check in with your provider about disease prevention and staying healthy. In between checkups, there are plenty of things you can do on your own. Experts have done a lot of research about which lifestyle changes and preventive measures are most likely to keep you healthy. Ask your health care provider for more information. Weight and diet Eat a healthy diet  Be sure to include plenty of vegetables, fruits, low-fat dairy products, and lean protein.  Do not eat a lot of foods high in solid fats, added sugars, or salt.  Get regular exercise. This is one of the most important things you can do for your health. ? Most adults should exercise for at least 150 minutes each week. The exercise should increase your heart rate and make you sweat (moderate-intensity exercise). ? Most adults should also do strengthening exercises at least twice a week. This is in addition to the moderate-intensity exercise.  Maintain a healthy weight  Body mass index (BMI) is a measurement that can be used to identify possible weight problems. It estimates body fat based on height and weight. Your health care provider can help determine your BMI and help you achieve or maintain a healthy weight.  For females 20 years of age and older: ? A BMI below 18.5 is considered underweight. ? A BMI of 18.5 to 24.9 is normal. ? A BMI of 25 to 29.9 is considered overweight. ? A BMI of 30 and above is considered obese.  Watch levels of cholesterol and blood lipids  You should start having your blood tested for lipids and cholesterol at 20  years of age, then have this test every 5 years.  You may need to have your cholesterol levels checked more often if: ? Your lipid or cholesterol levels are high. ? You are older than 20 years of age. ? You are at high risk for heart disease.  Cancer screening Lung Cancer  Lung cancer screening is recommended for adults 20-53 years old who are at high risk for lung cancer because of a history of smoking.  A yearly low-dose CT scan of the lungs is recommended for people who: ? Currently smoke. ? Have quit within the past 15 years. ? Have at least a 30-pack-year history of smoking. A pack year is smoking an average of one pack of cigarettes a day for 1 year.  Yearly screening should continue until it has been 15 years since you quit.  Yearly screening should stop if you develop a health problem that would prevent you from having lung cancer treatment.  Breast Cancer  Practice breast self-awareness. This means understanding how your breasts normally appear and feel.  It also means doing regular breast self-exams. Let your health care provider know about any changes, no matter how small.  If you are in your 20s or 30s, you should have a clinical breast exam (CBE) by a health care provider every 1-3 years as part of a regular health exam.  If you are 45 or older, have a CBE every year. Also consider having  a breast X-ray (mammogram) every year.  If you have a family history of breast cancer, talk to your health care provider about genetic screening.  If you are at high risk for breast cancer, talk to your health care provider about having an MRI and a mammogram every year.  Breast cancer gene (BRCA) assessment is recommended for women who have family members with BRCA-related cancers. BRCA-related cancers include: ? Breast. ? Ovarian. ? Tubal. ? Peritoneal cancers.  Results of the assessment will determine the need for genetic counseling and BRCA1 and BRCA2 testing.  Cervical  Cancer Your health care provider may recommend that you be screened regularly for cancer of the pelvic organs (ovaries, uterus, and vagina). This screening involves a pelvic examination, including checking for microscopic changes to the surface of your cervix (Pap test). You may be encouraged to have this screening done every 3 years, beginning at age 20.  For women ages 13-65, health care providers may recommend pelvic exams and Pap testing every 3 years, or they may recommend the Pap and pelvic exam, combined with testing for human papilloma virus (HPV), every 5 years. Some types of HPV increase your risk of cervical cancer. Testing for HPV may also be done on women of any age with unclear Pap test results.  Other health care providers may not recommend any screening for nonpregnant women who are considered low risk for pelvic cancer and who do not have symptoms. Ask your health care provider if a screening pelvic exam is right for you.  If you have had past treatment for cervical cancer or a condition that could lead to cancer, you need Pap tests and screening for cancer for at least 20 years after your treatment. If Pap tests have been discontinued, your risk factors (such as having a new sexual partner) need to be reassessed to determine if screening should resume. Some women have medical problems that increase the chance of getting cervical cancer. In these cases, your health care provider may recommend more frequent screening and Pap tests.  Colorectal Cancer  This type of cancer can be detected and often prevented.  Routine colorectal cancer screening usually begins at 20 years of age and continues through 20 years of age.  Your health care provider may recommend screening at an earlier age if you have risk factors for colon cancer.  Your health care provider may also recommend using home test kits to check for hidden blood in the stool.  A small camera at the end of a tube can be used to  examine your colon directly (sigmoidoscopy or colonoscopy). This is done to check for the earliest forms of colorectal cancer.  Routine screening usually begins at age 20.  Direct examination of the colon should be repeated every 5-10 years through 20 years of age. However, you may need to be screened more often if early forms of precancerous polyps or small growths are found.  Skin Cancer  Check your skin from head to toe regularly.  Tell your health care provider about any new moles or changes in moles, especially if there is a change in a mole's shape or color.  Also tell your health care provider if you have a mole that is larger than the size of a pencil eraser.  Always use sunscreen. Apply sunscreen liberally and repeatedly throughout the day.  Protect yourself by wearing long sleeves, pants, a wide-brimmed hat, and sunglasses whenever you are outside.  Heart disease, diabetes, and high blood pressure  High blood pressure causes heart disease and increases the risk of stroke. High blood pressure is more likely to develop in: ? People who have blood pressure in the high end of the normal range (130-139/85-89 mm Hg). ? People who are overweight or obese. ? People who are African American.  If you are 10-41 years of age, have your blood pressure checked every 3-5 years. If you are 41 years of age or older, have your blood pressure checked every year. You should have your blood pressure measured twice-once when you are at a hospital or clinic, and once when you are not at a hospital or clinic. Record the average of the two measurements. To check your blood pressure when you are not at a hospital or clinic, you can use: ? An automated blood pressure machine at a pharmacy. ? A home blood pressure monitor.  If you are between 37 years and 65 years old, ask your health care provider if you should take aspirin to prevent strokes.  Have regular diabetes screenings. This involves taking a  blood sample to check your fasting blood sugar level. ? If you are at a normal weight and have a low risk for diabetes, have this test once every three years after 20 years of age. ? If you are overweight and have a high risk for diabetes, consider being tested at a younger age or more often. Preventing infection Hepatitis B  If you have a higher risk for hepatitis B, you should be screened for this virus. You are considered at high risk for hepatitis B if: ? You were born in a country where hepatitis B is common. Ask your health care provider which countries are considered high risk. ? Your parents were born in a high-risk country, and you have not been immunized against hepatitis B (hepatitis B vaccine). ? You have HIV or AIDS. ? You use needles to inject street drugs. ? You live with someone who has hepatitis B. ? You have had sex with someone who has hepatitis B. ? You get hemodialysis treatment. ? You take certain medicines for conditions, including cancer, organ transplantation, and autoimmune conditions.  Hepatitis C  Blood testing is recommended for: ? Everyone born from [birth year] through 1963. ? Anyone with known risk factors for hepatitis C.  Sexually transmitted infections (STIs)  You should be screened for sexually transmitted infections (STIs) including gonorrhea and chlamydia if: ? You are sexually active and are younger than 20 years of age. ? You are older than 20 years of age and your health care provider tells you that you are at risk for this type of infection. ? Your sexual activity has changed since you were last screened and you are at an increased risk for chlamydia or gonorrhea. Ask your health care provider if you are at risk.  If you do not have HIV, but are at risk, it may be recommended that you take a prescription medicine daily to prevent HIV infection. This is called pre-exposure prophylaxis (PrEP). You are considered at risk if: ? You are sexually active and  do not regularly use condoms or know the HIV status of your partner(s). ? You take drugs by injection. ? You are sexually active with a partner who has HIV.  Talk with your health care provider about whether you are at high risk of being infected with HIV. If you choose to begin PrEP, you should first be tested for HIV. You should then be tested every 3 months  for as long as you are taking PrEP. Pregnancy  If you are premenopausal and you may become pregnant, ask your health care provider about preconception counseling.  If you may become pregnant, take 400 to 800 micrograms (mcg) of folic acid every day.  If you want to prevent pregnancy, talk to your health care provider about birth control (contraception). Osteoporosis and menopause  Osteoporosis is a disease in which the bones lose minerals and strength with aging. This can result in serious bone fractures. Your risk for osteoporosis can be identified using a bone density scan.  If you are 82 years of age or older, or if you are at risk for osteoporosis and fractures, ask your health care provider if you should be screened.  Ask your health care provider whether you should take a calcium or vitamin D supplement to lower your risk for osteoporosis.  Menopause may have certain physical symptoms and risks.  Hormone replacement therapy may reduce some of these symptoms and risks. Talk to your health care provider about whether hormone replacement therapy is right for you. Follow these instructions at home:  Schedule regular health, dental, and eye exams.  Stay current with your immunizations.  Do not use any tobacco products including cigarettes, chewing tobacco, or electronic cigarettes.  If you are pregnant, do not drink alcohol.  If you are breastfeeding, limit how much and how often you drink alcohol.  Limit alcohol intake to no more than 1 drink per day for nonpregnant women. One drink equals 12 ounces of beer, 5 ounces of  wine, or 1 ounces of hard liquor.  Do not use street drugs.  Do not share needles.  Ask your health care provider for help if you need support or information about quitting drugs.  Tell your health care provider if you often feel depressed.  Tell your health care provider if you have ever been abused or do not feel safe at home. This information is not intended to replace advice given to you by your health care provider. Make sure you discuss any questions you have with your health care provider. Document Released: 02/26/2011 Document Revised: 01/19/2016 Document Reviewed: 05/17/2015 Elsevier Interactive Patient Education  [employer].

## 2018-01-29 ENCOUNTER — Encounter: Payer: Self-pay | Admitting: Family Medicine

## 2018-01-29 LAB — QUANTIFERON-TB GOLD PLUS
Mitogen-NIL: 7.51 IU/mL
NIL: 0.04 IU/mL
QUANTIFERON-TB GOLD PLUS: NEGATIVE
TB1-NIL: 0 IU/mL
TB2-NIL: 0 IU/mL

## 2018-01-29 NOTE — Addendum Note (Signed)
Addended by: Abbe AmsterdamOPLAND, Avrum Kimball C on: 01/29/2018 09:55 AM   Modules accepted: Orders

## 2018-01-30 ENCOUNTER — Telehealth: Payer: Self-pay

## 2018-01-30 NOTE — Telephone Encounter (Signed)
Copied from CRM [phone]. Topic: Quick Communication - Lab Results >> Jan 30, 2018  8:14 AM Percival SpanishKennedy, Cheryl W wrote:  Pt need her lab results today and will need a call back before 12noon cause she has to go to work

## 2018-01-30 NOTE — Telephone Encounter (Signed)
Called patient, notified and verbalized understanding.

## 2018-02-03 ENCOUNTER — Ambulatory Visit (INDEPENDENT_AMBULATORY_CARE_PROVIDER_SITE_OTHER): Payer: 59 | Admitting: *Deleted

## 2018-02-03 DIAGNOSIS — J309 Allergic rhinitis, unspecified: Secondary | ICD-10-CM | POA: Diagnosis not present

## 2018-02-03 MED ORDER — ADAPALENE 0.3 % EX GEL: CUTANEOUS | 6 refills | Status: DC

## 2018-02-03 MED FILL — ADAPALENE 0.3% GEL: 0.3 | 30 days supply | Qty: 45 | Fill #0

## 2018-02-03 NOTE — Addendum Note (Signed)
Addended by: Abbe AmsterdamOPLAND, Anglia Blakley C on: 02/03/2018 12:38 PM   Modules accepted: Orders

## 2018-02-19 ENCOUNTER — Ambulatory Visit (INDEPENDENT_AMBULATORY_CARE_PROVIDER_SITE_OTHER): Payer: 59

## 2018-02-19 DIAGNOSIS — J309 Allergic rhinitis, unspecified: Secondary | ICD-10-CM

## 2018-02-21 ENCOUNTER — Other Ambulatory Visit: Payer: Self-pay | Admitting: Allergy and Immunology

## 2018-02-21 DIAGNOSIS — H1010 Acute atopic conjunctivitis, unspecified eye: Secondary | ICD-10-CM

## 2018-02-21 DIAGNOSIS — J302 Other seasonal allergic rhinitis: Secondary | ICD-10-CM

## 2018-02-21 MED FILL — MONTELUKAST SOD 10 MG TAB: 10 | 30 days supply | Qty: 30 | Fill #4

## 2018-02-21 MED FILL — raNITIdine HCL 150 MG TABS: 150 | 30 days supply | Qty: 60 | Fill #4

## 2018-03-04 ENCOUNTER — Ambulatory Visit (INDEPENDENT_AMBULATORY_CARE_PROVIDER_SITE_OTHER): Payer: 59

## 2018-03-04 DIAGNOSIS — J309 Allergic rhinitis, unspecified: Secondary | ICD-10-CM | POA: Diagnosis not present

## 2018-03-20 ENCOUNTER — Ambulatory Visit (INDEPENDENT_AMBULATORY_CARE_PROVIDER_SITE_OTHER): Payer: 59 | Admitting: *Deleted

## 2018-03-20 DIAGNOSIS — J309 Allergic rhinitis, unspecified: Secondary | ICD-10-CM | POA: Diagnosis not present

## 2018-03-21 DIAGNOSIS — J3089 Other allergic rhinitis: Secondary | ICD-10-CM | POA: Diagnosis not present

## 2018-04-01 ENCOUNTER — Ambulatory Visit (INDEPENDENT_AMBULATORY_CARE_PROVIDER_SITE_OTHER): Payer: 59

## 2018-04-01 DIAGNOSIS — J309 Allergic rhinitis, unspecified: Secondary | ICD-10-CM

## 2018-04-10 ENCOUNTER — Ambulatory Visit (INDEPENDENT_AMBULATORY_CARE_PROVIDER_SITE_OTHER): Payer: 59

## 2018-04-10 DIAGNOSIS — J309 Allergic rhinitis, unspecified: Secondary | ICD-10-CM | POA: Diagnosis not present

## 2018-04-16 ENCOUNTER — Ambulatory Visit (INDEPENDENT_AMBULATORY_CARE_PROVIDER_SITE_OTHER): Payer: 59 | Admitting: *Deleted

## 2018-04-16 DIAGNOSIS — J309 Allergic rhinitis, unspecified: Secondary | ICD-10-CM | POA: Diagnosis not present

## 2018-04-24 ENCOUNTER — Ambulatory Visit (INDEPENDENT_AMBULATORY_CARE_PROVIDER_SITE_OTHER): Payer: 59

## 2018-04-24 DIAGNOSIS — J309 Allergic rhinitis, unspecified: Secondary | ICD-10-CM

## 2018-04-25 MED FILL — MONTELUKAST SOD 10 MG TAB: 10 | 30 days supply | Qty: 30 | Fill #5

## 2018-04-25 MED FILL — raNITIdine HCL 150 MG TABS: 150 | 30 days supply | Qty: 60 | Fill #5

## 2018-05-01 ENCOUNTER — Ambulatory Visit (INDEPENDENT_AMBULATORY_CARE_PROVIDER_SITE_OTHER): Payer: 59

## 2018-05-01 DIAGNOSIS — J309 Allergic rhinitis, unspecified: Secondary | ICD-10-CM

## 2018-05-08 ENCOUNTER — Ambulatory Visit (INDEPENDENT_AMBULATORY_CARE_PROVIDER_SITE_OTHER): Payer: 59

## 2018-05-08 DIAGNOSIS — J309 Allergic rhinitis, unspecified: Secondary | ICD-10-CM

## 2018-05-13 ENCOUNTER — Ambulatory Visit (INDEPENDENT_AMBULATORY_CARE_PROVIDER_SITE_OTHER): Payer: 59 | Admitting: *Deleted

## 2018-05-13 DIAGNOSIS — J309 Allergic rhinitis, unspecified: Secondary | ICD-10-CM

## 2018-05-20 NOTE — Progress Notes (Signed)
Vial exp 05-21-19

## 2018-05-21 DIAGNOSIS — J3089 Other allergic rhinitis: Secondary | ICD-10-CM | POA: Diagnosis not present

## 2018-06-02 ENCOUNTER — Ambulatory Visit (INDEPENDENT_AMBULATORY_CARE_PROVIDER_SITE_OTHER): Payer: 59

## 2018-06-02 DIAGNOSIS — J309 Allergic rhinitis, unspecified: Secondary | ICD-10-CM | POA: Diagnosis not present

## 2018-06-06 ENCOUNTER — Ambulatory Visit (INDEPENDENT_AMBULATORY_CARE_PROVIDER_SITE_OTHER): Payer: 59

## 2018-06-06 DIAGNOSIS — Z23 Encounter for immunization: Secondary | ICD-10-CM

## 2018-07-03 ENCOUNTER — Ambulatory Visit (INDEPENDENT_AMBULATORY_CARE_PROVIDER_SITE_OTHER): Payer: 59

## 2018-07-03 DIAGNOSIS — J309 Allergic rhinitis, unspecified: Secondary | ICD-10-CM

## 2018-07-08 ENCOUNTER — Ambulatory Visit (INDEPENDENT_AMBULATORY_CARE_PROVIDER_SITE_OTHER): Payer: 59

## 2018-07-08 DIAGNOSIS — J309 Allergic rhinitis, unspecified: Secondary | ICD-10-CM | POA: Diagnosis not present

## 2018-07-21 ENCOUNTER — Ambulatory Visit (INDEPENDENT_AMBULATORY_CARE_PROVIDER_SITE_OTHER): Payer: 59 | Admitting: *Deleted

## 2018-07-21 DIAGNOSIS — J309 Allergic rhinitis, unspecified: Secondary | ICD-10-CM

## 2018-08-08 ENCOUNTER — Ambulatory Visit (INDEPENDENT_AMBULATORY_CARE_PROVIDER_SITE_OTHER): Payer: 59

## 2018-08-08 DIAGNOSIS — J309 Allergic rhinitis, unspecified: Secondary | ICD-10-CM

## 2018-08-28 ENCOUNTER — Ambulatory Visit (INDEPENDENT_AMBULATORY_CARE_PROVIDER_SITE_OTHER): Payer: 59

## 2018-08-28 DIAGNOSIS — J309 Allergic rhinitis, unspecified: Secondary | ICD-10-CM

## 2018-09-04 ENCOUNTER — Ambulatory Visit (INDEPENDENT_AMBULATORY_CARE_PROVIDER_SITE_OTHER): Payer: 59

## 2018-09-04 DIAGNOSIS — J309 Allergic rhinitis, unspecified: Secondary | ICD-10-CM

## 2018-09-16 ENCOUNTER — Ambulatory Visit (INDEPENDENT_AMBULATORY_CARE_PROVIDER_SITE_OTHER): Payer: 59

## 2018-09-16 DIAGNOSIS — J309 Allergic rhinitis, unspecified: Secondary | ICD-10-CM | POA: Diagnosis not present

## 2018-09-24 NOTE — Progress Notes (Signed)
[facility] [address] [place], [place] [address] [phone]  Date:  09/25/2018   Name:  Alexis Dixon   DOB:  06/11/1996   MRN:  [id number]  PCP:  Pearline Cables, MD    Chief Complaint: Anxiety (Panic attacks, in nursing school) and Contraception (not currently on Birth Control)   History of Present Illness:  Alexis Dixon is a 23 y.o. very pleasant female patient who presents with the following:  Patient with history of mild asthma, allergies, and headaches.  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   She does not have these attacks when she is running for exercise, or exposed to temperature change, etc She may notice a panic attack when she is in larger groups; school programs, concerts, etc  She has some generalized anxiety, but this is more insomnia the night before a test, etc She does not feel like she is depressed She feels like she has anxiety 25- 30% of the time This may be related to school, or tensions with her parents She is having a panic attack twice a week on average.    No suicidal ideation  Right now she will "hide in the bathroom" for 15 - 20 minutes into the panic attack passes  Options for treatment with her today.  We will start on a low-dose SSRI for background anxiety, and give a prescription for Xanax to use as needed for panic attack  She is engaged, the wedding date is May 9th She is excited about her wedding She is not SA, plans to wait until the wedding   She would like to start  contraception prior to her wedding, she is most interested in IUD.  Discussed this with her today, will refer to GYN for probable placement of the IUD  Patient Active Problem List   Diagnosis Date Noted  . Mild intermittent asthma without complication 09/23/2017  . Dermographia 09/23/2017  . Wheezing 11/29/2016  . Other seasonal allergic rhinitis 01/24/2016  . Anaphylactic shock due to adverse food reaction 01/24/2016  . Seasonal allergic conjunctivitis 01/24/2016  . Headache(784.0) 07/05/2013    Past Medical History:  Diagnosis Date  . Allergy    Tree Nuts ; Claritin D  . Asthma    childhood; Albuterol and Singulair; no hospitalizations and ED visits.  . Eczema   . DHWYSHUO(372.9)     Past Surgical History:  Procedure Laterality Date  . WISDOM TOOTH EXTRACTION      Social History   Tobacco Use  . Smoking status: Never Smoker  . Smokeless tobacco: Never Used  Substance Use Topics  . Alcohol use: No  . Drug use: No    Family History  Problem Relation Age of Onset  . Diabetes Mother   . Hyperlipidemia Maternal Grandmother   . Hypertension Maternal Grandmother   . Diabetes Maternal Grandmother   . Asthma Paternal Grandmother   . Asthma Sister   . Eczema Maternal Grandfather     Allergies  Allergen Reactions  . Other Swelling    Tree Nuts     Medication list has been reviewed and updated.  Current Outpatient Medications on File Prior to Visit  Medication Sig Dispense Refill  . Adapalene 0.3 % gel Apply topically at bedtime 45 g 6  . albuterol (PROVENTIL HFA;VENTOLIN HFA) 108 (90 Base) MCG/ACT inhaler Inhale 2 puffs into the lungs every 6 (six) hours as needed for wheezing or shortness of breath. Reported on 08/16/2015 18 g 1  . EPINEPHrine (EPIPEN 2-PAK) 0.3 mg/0.3 mL IJ SOAJ injection Inject 0.3 mLs (0.3 mg total) into the muscle once. 1 Device 1  . levocetirizine (XYZAL) 5 MG tablet TAKE 1 TABLET BY MOUTH EVERY EVENING.*NEED OFFICE VISIT FOR ADDITIONAL  REFILLS PER MD* 30 tablet 0  . montelukast (SINGULAIR) 10 MG tablet Take 1 tablet (10 mg total) by mouth at bedtime. 30 tablet 5  . olopatadine (PATANOL) 0.1 % ophthalmic solution Place 1 drop into both eyes 2 (two) times daily. 5 mL 0  . SUMAtriptan (IMITREX) 100 MG tablet Take 1 tablet (100 mg total) by mouth every 2 (two) hours as needed for migraine. May repeat in 2 hours if headache persists or recurs. 8 tablet 5   No current facility-administered medications on file prior to visit.     Review of Systems:  As per HPI- otherwise negative. No fever or chills, no chest pain or shortness of breath  Physical Examination: Vitals:   09/25/18 1317  BP: 124/80  Pulse: 95  Resp: 16  Temp: 98 F (36.7 C)  SpO2: 100%   Vitals:   09/25/18 1317  Weight: 159 lb (72.1 kg)  Height: 5' 2.5" (1.588 m)   Body mass index is 28.62 kg/m. Ideal Body Weight: Weight in (lb) to have BMI = 25: 138.6  GEN: WDWN, NAD, Non-toxic, A & O x 3, mild overweight, looks well HEENT: Atraumatic, Normocephalic. Neck supple. No masses, No LAD. Ears and Nose: No external deformity. CV: RRR, No M/G/R. No JVD. No thrill. No extra heart sounds. PULM: CTA B, no wheezes, crackles, rhonchi. No retractions. No resp. distress. No accessory muscle use. EXTR: No c/c/e NEURO Normal gait.  PSYCH: Normally interactive. Conversant. Not depressed or anxious appearing.  Calm demeanor.    Assessment and Plan: Encounter for other contraceptive management - Plan: Ambulatory referral to Obstetrics / Gynecology  GAD (generalized anxiety disorder) - Plan: FLUoxetine (PROZAC) 10 MG capsule  Panic attacks - Plan: ALPRAZolam (XANAX) 0.25 MG tablet  Here today with 2 concerns She is getting married soon and would like to start on contraception.  Her preferred method is not IUD.  Referred to GYN to discuss this further.  She is not currently sexually active so there is no concern of pregnancy  For anxiety, will start on 10 mg of  Prozac.  She may increase this to 20 mg after 2 weeks if needed.  Also gave her a small supply of Xanax 0.25.  Reminded her that this can be sedating, do not use if she is driving.  Also reminded that this medication can be habit-forming, she will use as little as possible  Signed Abbe Amsterdam, MD

## 2018-09-25 ENCOUNTER — Ambulatory Visit: Payer: 59 | Admitting: Family Medicine

## 2018-09-25 ENCOUNTER — Encounter: Payer: Self-pay | Admitting: Family Medicine

## 2018-09-25 VITALS — BP 124/80 | HR 95 | Temp 98.0°F | Resp 16 | Ht 62.5 in | Wt 159.0 lb

## 2018-09-25 DIAGNOSIS — F411 Generalized anxiety disorder: Secondary | ICD-10-CM

## 2018-09-25 DIAGNOSIS — F410 Panic disorder [episodic paroxysmal anxiety] without agoraphobia: Secondary | ICD-10-CM | POA: Diagnosis not present

## 2018-09-25 DIAGNOSIS — Z308 Encounter for other contraceptive management: Secondary | ICD-10-CM | POA: Diagnosis not present

## 2018-09-25 MED ORDER — FLUOXETINE HCL 10 MG PO CAPS: 10.0000 mg | ORAL_CAPSULE | Freq: Every day | ORAL | 6 refills | Status: DC

## 2018-09-25 MED ORDER — ALPRAZOLAM 0.25 MG PO TABS: 0.2500 mg | ORAL_TABLET | Freq: Two times a day (BID) | ORAL | 0 refills | Status: DC | PRN

## 2018-09-25 MED FILL — ALPRAZolam 0.25 MG TABS: 0.25 | 10 days supply | Qty: 20 | Fill #0

## 2018-09-25 MED FILL — FLUoxetine HCL 10 MG CAPS: 10 | 30 days supply | Qty: 60 | Fill #0

## 2018-09-25 NOTE — Patient Instructions (Signed)
It was very nice to see you today, Best wishes for your upcoming wedding. For anxiety, let us try Prozac 10 mg.  You can increase this to 2 pills or 20 mg after 2 weeks if you like You will take this daily in hopes of preventing anxiety I also gave you a low-dose of Xanax to use as needed for panic attack.  Please be aware this medication can make you sleepy, do not use it we need to drive. Use this as little as you can, as it can be habit-forming  I have referred you to gynecology to discuss having an IUD placed.  Do mention that you want an IUD when they call you to schedule.  Please see me in 2 or 3 months to follow-up on anxiety.  If things are going very well, you may also send me a MyChart message with an update instead

## 2018-10-03 ENCOUNTER — Ambulatory Visit (INDEPENDENT_AMBULATORY_CARE_PROVIDER_SITE_OTHER): Payer: 59

## 2018-10-03 DIAGNOSIS — J309 Allergic rhinitis, unspecified: Secondary | ICD-10-CM | POA: Diagnosis not present

## 2018-10-23 ENCOUNTER — Ambulatory Visit (INDEPENDENT_AMBULATORY_CARE_PROVIDER_SITE_OTHER): Payer: 59

## 2018-10-23 DIAGNOSIS — J309 Allergic rhinitis, unspecified: Secondary | ICD-10-CM

## 2018-10-30 ENCOUNTER — Ambulatory Visit (INDEPENDENT_AMBULATORY_CARE_PROVIDER_SITE_OTHER): Payer: 59 | Admitting: Family Medicine

## 2018-10-30 ENCOUNTER — Encounter: Payer: Self-pay | Admitting: Family Medicine

## 2018-10-30 VITALS — BP 119/66 | HR 80 | Ht 62.0 in | Wt 155.0 lb

## 2018-10-30 DIAGNOSIS — Z30430 Encounter for insertion of intrauterine contraceptive device: Secondary | ICD-10-CM

## 2018-10-30 DIAGNOSIS — Z3202 Encounter for pregnancy test, result negative: Secondary | ICD-10-CM | POA: Diagnosis not present

## 2018-10-30 DIAGNOSIS — Z01419 Encounter for gynecological examination (general) (routine) without abnormal findings: Secondary | ICD-10-CM | POA: Diagnosis not present

## 2018-10-30 DIAGNOSIS — Z124 Encounter for screening for malignant neoplasm of cervix: Secondary | ICD-10-CM | POA: Diagnosis not present

## 2018-10-30 DIAGNOSIS — Z01812 Encounter for preprocedural laboratory examination: Secondary | ICD-10-CM

## 2018-10-30 LAB — POCT URINE PREGNANCY: Preg Test, Ur: NEGATIVE

## 2018-10-30 MED ORDER — LEVONORGESTREL 19.5 MCG/DAY IU IUD
INTRAUTERINE_SYSTEM | Freq: Once | INTRAUTERINE | Status: AC
  Administered 2018-10-30: 1 via INTRAUTERINE

## 2018-10-30 NOTE — Progress Notes (Signed)
GYNECOLOGY ANNUAL PREVENTATIVE CARE ENCOUNTER NOTE  Subjective:   Alexis Dixon is a 23 y.o. G0P0000 female here for a routine annual gynecologic exam.  Current complaints: none.   Denies abnormal vaginal bleeding, discharge, pelvic pain, problems with intercourse or other gynecologic concerns.    Gynecologic History Patient's last menstrual period was 10/22/2018. Patient is not sexually active  Contraception: none Last Pap: n/a. Last mammogram: n/a.  Obstetric History OB History  Gravida Para Term Preterm AB Living  0 0 0 0 0 0  SAB TAB Ectopic Multiple Live Births  0 0 0 0 0    Past Medical History:  Diagnosis Date  . Allergy    Tree Nuts ; Claritin D  . Asthma    childhood; Albuterol and Singulair; no hospitalizations and ED visits.  . Eczema   . XBJYNWGN(562.1)     Past Surgical History:  Procedure Laterality Date  . WISDOM TOOTH EXTRACTION      Current Outpatient Medications on File Prior to Visit  Medication Sig Dispense Refill  . Adapalene 0.3 % gel Apply topically at bedtime 45 g 6  . albuterol (PROVENTIL HFA;VENTOLIN HFA) 108 (90 Base) MCG/ACT inhaler Inhale 2 puffs into the lungs every 6 (six) hours as needed for wheezing or shortness of breath. Reported on 08/16/2015 18 g 1  . ALPRAZolam (XANAX) 0.25 MG tablet Take 1 tablet (0.25 mg total) by mouth 2 (two) times daily as needed (panic attack). 20 tablet 0  . EPINEPHrine (EPIPEN 2-PAK) 0.3 mg/0.3 mL IJ SOAJ injection Inject 0.3 mLs (0.3 mg total) into the muscle once. 1 Device 1  . FLUoxetine (PROZAC) 10 MG capsule Take 1 capsule (10 mg total) by mouth daily. Increase to 2 pills after 2 weeks if needed 60 capsule 6  . levocetirizine (XYZAL) 5 MG tablet TAKE 1 TABLET BY MOUTH EVERY EVENING.*NEED OFFICE VISIT FOR ADDITIONAL REFILLS PER MD* 30 tablet 0  . montelukast (SINGULAIR) 10 MG tablet Take 1 tablet (10 mg total) by mouth at bedtime. 30 tablet 5  . olopatadine (PATANOL) 0.1 % ophthalmic solution Place 1  drop into both eyes 2 (two) times daily. 5 mL 0  . SUMAtriptan (IMITREX) 100 MG tablet Take 1 tablet (100 mg total) by mouth every 2 (two) hours as needed for migraine. May repeat in 2 hours if headache persists or recurs. 8 tablet 5   No current facility-administered medications on file prior to visit.     Allergies  Allergen Reactions  . Other Swelling    Tree Nuts     Social History   Socioeconomic History  . Marital status: Single    Spouse name: Not on file  . Number of children: Not on file  . Years of education: 37  . Highest education level: Not on file  Occupational History  . Not on file  Social Needs  . Financial resource strain: Not on file  . Food insecurity:    Worry: Not on file    Inability: Not on file  . Transportation needs:    Medical: Not on file    Non-medical: Not on file  Tobacco Use  . Smoking status: Never Smoker  . Smokeless tobacco: Never Used  Substance and Sexual Activity  . Alcohol use: No  . Drug use: No  . Sexual activity: Never  Lifestyle  . Physical activity:    Days per week: Not on file    Minutes per session: Not on file  . Stress: Not  on file  Relationships  . Social connections:    Talks on phone: Not on file    Gets together: Not on file    Attends religious service: Not on file    Active member of club or organization: Not on file    Attends meetings of clubs or organizations: Not on file    Relationship status: Not on file  . Intimate partner violence:    Fear of current or ex partner: Not on file    Emotionally abused: Not on file    Physically abused: Not on file    Forced sexual activity: Not on file  Other Topics Concern  . Not on file  Social History Narrative   Marital Status: Single ; not dating   Living Situation: Lives with  Parents, sister (79)   Education: 11th Grade ([employer]) ; makes ABs; favorite subject English; career plans nursing.  No fails or being held back.   Tobacco Use/Exposure:   None    Diet:  Regular   Favorite Subject:  History    Hobbies:  Playing [employer]; reading; into books.  Teenage books.  Comic books.             Family History  Problem Relation Age of Onset  . Diabetes Mother   . Hyperlipidemia Maternal Grandmother   . Hypertension Maternal Grandmother   . Diabetes Maternal Grandmother   . Cancer Maternal Grandmother        breast  . Asthma Paternal Grandmother   . Asthma Sister   . Eczema Maternal Grandfather     The following portions of the patient's history were reviewed and updated as appropriate: allergies, current medications, past family history, past medical history, past social history, past surgical history and problem list.  Review of Systems Pertinent items are noted in HPI.   Objective:  BP 119/66   Pulse 80   Ht 5\' 2"  (1.575 m)   Wt 155 lb (70.3 kg)   LMP 10/22/2018   BMI 28.35 kg/m  Wt Readings from Last 3 Encounters:  10/30/18 155 lb (70.3 kg)  09/25/18 159 lb (72.1 kg)  01/27/18 144 lb (65.3 kg)     CONSTITUTIONAL: Well-developed, well-nourished female in no acute distress.  HENT:  Normocephalic, atraumatic, External right and left ear normal. Oropharynx is clear and moist EYES: Conjunctivae and EOM are normal. Pupils are equal, round, and reactive to light. No scleral icterus.  NECK: Normal range of motion, supple, no masses.  Normal thyroid.   CARDIOVASCULAR: Normal heart rate noted, regular rhythm RESPIRATORY: Clear to auscultation bilaterally. Effort and breath sounds normal, no problems with respiration noted. BREASTS: Symmetric in size. No masses, skin changes, nipple drainage, or lymphadenopathy. ABDOMEN: Soft, normal bowel sounds, no distention noted.  No tenderness, rebound or guarding.  PELVIC: Normal appearing external genitalia; normal appearing vaginal mucosa and cervix.  No abnormal discharge noted.  Normal uterine size, no other palpable masses, no uterine or adnexal  tenderness. MUSCULOSKELETAL: Normal range of motion. No tenderness.  No cyanosis, clubbing, or edema.  2+ distal pulses. SKIN: Skin is warm and dry. No rash noted. Not diaphoretic. No erythema. No pallor. NEUROLOGIC: Alert and oriented to person, place, and time. Normal reflexes, muscle tone coordination. No cranial nerve deficit noted. PSYCHIATRIC: Normal mood and affect. Normal behavior. Normal judgment and thought content.  Assessment:  Annual gynecologic examination with pap smear   Plan:  1. Well Woman Exam Will follow up results of pap smear  and manage accordingly.  2. IUD insertion See separate procedure note - POCT urine pregnancy   Routine preventative health maintenance measures emphasized. Please refer to After Visit Summary for other counseling recommendations.    Candelaria Celeste, DO [facility]

## 2018-10-30 NOTE — Patient Instructions (Signed)

## 2018-10-30 NOTE — Progress Notes (Signed)
IUD Procedure Note Patient identified, informed consent performed, signed copy in chart, time out was performed.  Urine pregnancy test negative.  Speculum placed in the vagina.  Cervix visualized.  Cleaned with Betadine x 2.  Grasped anteriorly with a single tooth tenaculum.  Inner os dilated with plastic dilator. Uterus sounded to 7 cm.  Liletta  IUD placed per manufacturer's recommendations.  Strings trimmed to 3 cm. Tenaculum was removed, good hemostasis noted.  Patient tolerated procedure well.   Patient given post procedure instructions and Liletta care card with expiration date.  Patient is asked to check IUD strings periodically and follow up in 4-6 weeks for IUD check.

## 2018-11-04 ENCOUNTER — Ambulatory Visit (INDEPENDENT_AMBULATORY_CARE_PROVIDER_SITE_OTHER): Payer: 59

## 2018-11-04 DIAGNOSIS — J309 Allergic rhinitis, unspecified: Secondary | ICD-10-CM

## 2018-11-04 LAB — CYTOLOGY - PAP: Diagnosis: NEGATIVE

## 2018-11-11 DIAGNOSIS — H5211 Myopia, right eye: Secondary | ICD-10-CM | POA: Diagnosis not present

## 2018-11-13 DIAGNOSIS — J3089 Other allergic rhinitis: Secondary | ICD-10-CM | POA: Diagnosis not present

## 2018-11-13 NOTE — Progress Notes (Signed)
Vial Exp 11-13-2019 AH

## 2018-11-14 MED FILL — FLUOXETINE HCL 10 MG CAPS: 10 | 30 days supply | Qty: 60 | Fill #1

## 2018-11-26 ENCOUNTER — Ambulatory Visit (INDEPENDENT_AMBULATORY_CARE_PROVIDER_SITE_OTHER): Payer: 59

## 2018-11-26 DIAGNOSIS — J309 Allergic rhinitis, unspecified: Secondary | ICD-10-CM

## 2018-11-27 ENCOUNTER — Ambulatory Visit: Payer: 59 | Admitting: Obstetrics & Gynecology

## 2018-12-04 ENCOUNTER — Other Ambulatory Visit: Payer: Self-pay

## 2018-12-04 ENCOUNTER — Emergency Department (INDEPENDENT_AMBULATORY_CARE_PROVIDER_SITE_OTHER): Payer: 59

## 2018-12-04 ENCOUNTER — Emergency Department
Admission: EM | Admit: 2018-12-04 | Discharge: 2018-12-04 | Disposition: A | Discharge status: Home / Self Care | Payer: 59 | Source: Home / Self Care

## 2018-12-04 DIAGNOSIS — R109 Unspecified abdominal pain: Secondary | ICD-10-CM | POA: Diagnosis not present

## 2018-12-04 DIAGNOSIS — R102 Pelvic and perineal pain: Secondary | ICD-10-CM

## 2018-12-04 DIAGNOSIS — R1012 Left upper quadrant pain: Secondary | ICD-10-CM

## 2018-12-04 DIAGNOSIS — R1013 Epigastric pain: Secondary | ICD-10-CM

## 2018-12-04 LAB — POCT URINALYSIS DIP (MANUAL ENTRY)
Bilirubin, UA: NEGATIVE
Glucose, UA: NEGATIVE mg/dL
Ketones, POC UA: NEGATIVE mg/dL
Leukocytes, UA: NEGATIVE
Nitrite, UA: NEGATIVE
Protein Ur, POC: NEGATIVE mg/dL
Spec Grav, UA: 1.025 (ref 1.010–1.025)
Urobilinogen, UA: 0.2 E.U./dL
pH, UA: 6 (ref 5.0–8.0)

## 2018-12-04 LAB — POCT CBC W AUTO DIFF (K'VILLE URGENT CARE)
Hematocrit: 41.5 % (ref 38.5–51.0)
Hemoglobin: 13.5 g/dL (ref 13.0–17.0)
Lymphocytes absolute: 2.6 10*3/uL (ref 0.1–3.0)
Lymphocytes relative %: 45.2 % — ABNORMAL HIGH (ref 15–45)
MCH: 28.9 pg (ref 26.5–32.5)
MCHC: 32.4 g/dL — ABNORMAL LOW (ref 32.5–36.9)
MCV: 89.3 fL (ref 80.0–98.0)
MPV: 7.6 fL — AB (ref 7.8–11)
Monocyes absolute: 0.5 10*3/uL (ref 0.1–1)
Monocytes relative %: 9 % (ref 2–10)
Neutrophils absolute (GR#): 2.7 10*3/uL (ref 1.7–7.7)
Neutrophils relative % (GR): 45.8 % (ref 44–76)
Platelet count: 298 10*3/uL (ref 140–400)
RBC: 4.65 MIL/uL (ref 4.20–5.80)
RDW: 13.3 % (ref 11.6–14)
WBC: 5.8 10*3/uL (ref 4.5–10.5)

## 2018-12-04 LAB — POCT URINE PREGNANCY: Preg Test, Ur: NEGATIVE

## 2018-12-04 MED ORDER — OMEPRAZOLE 20 MG PO CPDR: 20.0000 mg | DELAYED_RELEASE_CAPSULE | Freq: Every day | ORAL | 0 refills | Status: DC

## 2018-12-04 MED FILL — OMEPRAZOLE 20 MG CPDR: 20 | 30 days supply | Qty: 30 | Fill #0

## 2018-12-04 NOTE — Discharge Instructions (Signed)
°  Although the imaging was read as normal, you do have some gas located in your Left upper abdomen, which may be contributing to some of your discomfort. Be sure to stay well hydrated. If you are feeling constipated, you may try an over the counter stool softener such as miralax or colace.  Try to stick with a bland diet for the next few days as the pain may also be due to gastritis or a stomach ulcer, irritation of your stomach lining from spicy foods you have recently eaten. Limit or avoid use of NSAIDs (iburpofen, naproxen, Motrin Advil Aleve) to limit irritation to your stomach.   Please call to schedule a follow up appointment with family medicine in 1 week if not improving, Call 911 or go to the hospital if symptoms worsening.

## 2018-12-04 NOTE — ED Provider Notes (Signed)
[facility]    CSN: [id number] Arrival date & time: 12/04/18  0808     History   Chief Complaint Chief Complaint  Patient presents with  . Abdominal Pain    HPI Alexis Dixon is a 23 y.o. female.   HPI  Alexis Dixon is a 23 y.o. female presenting to UC with c/o LUQ abdominal pain that started suddenly around 12AM after eating gumbo for dinner. Pain is sharp, worse with taking a deep breath. Pain is 7/10 in severity, worse with deep breathing.  Mild nausea. She has not tried anything for pain PTA. Denies fever, chills, vomiting or diarrhea. LMP 11/26/2018.  Denies sick contacts or recent travel. No known injury. No hx of kidney stones. No hx of gallbladder problems but her mother had her gallbladder removed a few years ago.    Past Medical History:  Diagnosis Date  . Allergy    Tree Nuts ; Claritin D  . Asthma    childhood; Albuterol and Singulair; no hospitalizations and ED visits.  . Eczema   . GEXBMWUX(324.4Headache(784.0)     Patient Active Problem List   Diagnosis Date Noted  . Mild intermittent asthma without complication 09/23/2017  . Dermographia 09/23/2017  . Wheezing 11/29/2016  . Other seasonal allergic rhinitis 01/24/2016  . Anaphylactic shock due to adverse food reaction 01/24/2016  . Seasonal allergic conjunctivitis 01/24/2016  . Headache(784.0) 07/05/2013    Past Surgical History:  Procedure Laterality Date  . WISDOM TOOTH EXTRACTION      OB History    Gravida  0   Para  0   Term  0   Preterm  0   AB  0   Living  0     SAB  0   TAB  0   Ectopic  0   Multiple  0   Live Births  0            Home Medications    Prior to Admission medications   Medication Sig Start Date End Date Taking? Authorizing Provider  Adapalene 0.3 % gel Apply topically at bedtime 02/03/18  Yes Copland, Gwenlyn FoundJessica C, MD  albuterol (PROVENTIL HFA;VENTOLIN HFA) 108 (90 Base) MCG/ACT inhaler Inhale 2 puffs into the lungs every 6 (six) hours as needed for  wheezing or shortness of breath. Reported on 08/16/2015 05/02/16  Yes Ethelda ChickSmith, Kristi M, MD  ALPRAZolam Prudy Feeler(XANAX) 0.25 MG tablet Take 1 tablet (0.25 mg total) by mouth 2 (two) times daily as needed (panic attack). 09/25/18  Yes Copland, Gwenlyn FoundJessica C, MD  EPINEPHrine (EPIPEN 2-PAK) 0.3 mg/0.3 mL IJ SOAJ injection Inject 0.3 mLs (0.3 mg total) into the muscle once. 08/29/15  Yes Ethelda ChickSmith, Kristi M, MD  FLUoxetine (PROZAC) 10 MG capsule Take 1 capsule (10 mg total) by mouth daily. Increase to 2 pills after 2 weeks if needed 09/25/18  Yes Copland, Gwenlyn FoundJessica C, MD  levocetirizine (XYZAL) 5 MG tablet TAKE 1 TABLET BY MOUTH EVERY EVENING.*NEED OFFICE VISIT FOR ADDITIONAL REFILLS PER MD* 01/17/18  Yes Bobbitt, Heywood Ilesalph Carter, MD  montelukast (SINGULAIR) 10 MG tablet Take 1 tablet (10 mg total) by mouth at bedtime. 09/23/17  Yes Bardelas, Jose A, MD  olopatadine (PATANOL) 0.1 % ophthalmic solution Place 1 drop into both eyes 2 (two) times daily. 07/31/14  Yes Carmelina DaneAnderson, Jeffery S, MD  SUMAtriptan (IMITREX) 100 MG tablet Take 1 tablet (100 mg total) by mouth every 2 (two) hours as needed for migraine. May repeat in 2 hours if headache persists  or recurs. 09/30/17  Yes Copland, Gwenlyn Found, MD  omeprazole (PRILOSEC) 20 MG capsule Take 1 capsule (20 mg total) by mouth daily. 12/04/18   Lurene Shadow, PA-C    Family History Family History  Problem Relation Age of Onset  . Diabetes Mother   . Hyperlipidemia Maternal Grandmother   . Hypertension Maternal Grandmother   . Diabetes Maternal Grandmother   . Cancer Maternal Grandmother        breast  . Asthma Paternal Grandmother   . Asthma Sister   . Eczema Maternal Grandfather     Social History Social History   Tobacco Use  . Smoking status: Never Smoker  . Smokeless tobacco: Never Used  Substance Use Topics  . Alcohol use: Yes    Comment: socialy  . Drug use: No     Allergies   Other   Review of Systems Review of Systems  Constitutional: Negative for chills and  fever.  HENT: Negative for congestion, ear pain, sore throat, trouble swallowing and voice change.   Respiratory: Negative for cough and shortness of breath.   Cardiovascular: Negative for chest pain and palpitations.  Gastrointestinal: Positive for abdominal pain and nausea. Negative for diarrhea and vomiting.  Genitourinary: Positive for flank pain (Left). Negative for dysuria, frequency and hematuria.  Musculoskeletal: Negative for arthralgias, back pain and myalgias.  Skin: Negative for rash.     Physical Exam Triage Vital Signs ED Triage Vitals  Enc Vitals Group     BP 12/04/18 0834 120/82     Pulse Rate 12/04/18 0834 95     Resp 12/04/18 0834 16     Temp 12/04/18 0834 98.4 F (36.9 C)     Temp Source 12/04/18 0834 Oral     SpO2 12/04/18 0834 99 %     Weight 12/04/18 0835 150 lb (68 kg)     Height 12/04/18 0835  (1.575 m)     Head Circumference --      Peak Flow --      Pain Score --      Pain Loc --      Pain Edu? --      Excl. in GC? --    No data found.  Updated Vital Signs BP 122/80   Pulse 90   Temp 98.4 F (36.9 C) (Oral)   Resp 16   Ht  (1.575 m)   Wt 150 lb (68 kg)   LMP 11/26/2018 (Exact Date)   SpO2 99%   BMI 27.44 kg/m   Visual Acuity Right Eye Distance:   Left Eye Distance:   Bilateral Distance:    Right Eye Near:   Left Eye Near:    Bilateral Near:     Physical Exam Vitals signs and nursing note reviewed.  Constitutional:      Appearance: She is well-developed.  HENT:     Head: Normocephalic and atraumatic.  Neck:     Musculoskeletal: Normal range of motion.  Cardiovascular:     Rate and Rhythm: Normal rate and regular rhythm.  Pulmonary:     Effort: Pulmonary effort is normal.     Breath sounds: Normal breath sounds.  Abdominal:     General: There is no distension.     Palpations: Abdomen is soft.     Tenderness: There is abdominal tenderness in the epigastric area, periumbilical area, suprapubic area, left upper  quadrant and left lower quadrant. There is guarding ( LUQ). There is no right CVA tenderness, left CVA  tenderness or rebound.  Musculoskeletal: Normal range of motion.  Skin:    General: Skin is warm and dry.  Neurological:     Mental Status: She is alert and oriented to person, place, and time.  Psychiatric:        Behavior: Behavior normal.      UC Treatments / Results  Labs (all labs ordered are listed, but only abnormal results are displayed) Labs Reviewed  COMPLETE METABOLIC PANEL WITH GFR  LIPASE  POCT URINALYSIS DIP (MANUAL ENTRY)  POCT URINE PREGNANCY  POCT CBC W AUTO DIFF ([facility])    EKG None  Radiology US Abdomen Complete  Result Date: 12/04/2018 CLINICAL DATA:  Left upper quadrant and epigastric pain for 9 hours EXAM: ABDOMEN ULTRASOUND COMPLETE COMPARISON:  None. FINDINGS: Gallbladder: No gallstones or wall thickening visualized. No sonographic Murphy sign noted by sonographer. Common bile duct: Diameter: 2 mm. Liver: No focal lesion identified. Within normal limits in parenchymal echogenicity. Portal vein is patent on color Doppler imaging with normal direction of blood flow towards the liver. IVC: No abnormality visualized. Pancreas: Visualized portion unremarkable. Spleen: Size and appearance within normal limits. Right Kidney: Length: 10.2 cm. Echogenicity within normal limits. No mass or hydronephrosis visualized. Left Kidney: Length: 11.3 cm. Echogenicity within normal limits. No mass or hydronephrosis visualized. Abdominal aorta: No aneurysm visualized. Other findings: None. IMPRESSION: No intra-abdominal pathology. Electronically Signed   By: Jolaine Click M.D.   On: 12/04/2018 09:49   Dg Abdomen Acute W/chest  Result Date: 12/04/2018 CLINICAL DATA:  Left upper quadrant pain. EXAM: DG ABDOMEN ACUTE W/ 1V CHEST COMPARISON:  None. FINDINGS: There is no evidence of dilated bowel loops or free intraperitoneal air. No radiopaque calculi or other significant  radiographic abnormality is seen. Heart size and mediastinal contours are within normal limits. Both lungs are clear. IUD in place in the central pelvis. IMPRESSION: Negative abdominal radiographs.  No acute cardiopulmonary disease. Electronically Signed   By: Marnee Spring M.D.   On: 12/04/2018 09:59    Procedures Procedures (including critical care time)  Medications Ordered in UC Medications - No data to display  Initial Impression / Assessment and Plan / UC Course  I have reviewed the triage vital signs and the nursing notes.  Pertinent labs & imaging results that were available during my care of the patient were reviewed by me and considered in my medical decision making (see chart for details).     Pt c/o LUQ abdominal pain that is worse with deep breathing On exam, pt is tender to Epigastric region and across left side of abdomen into suprapubic region. Vitals: WNL Reviewed imaging with pt. Although, Acute Abd w/ chest was read as normal, there is gas noted in LUQ region where pt's main pain is, question if this is contributing to pt's symptoms. Otherwise, no evidence of surgical abdomen.   Will try conservative tx at this time. Encouraged f/u with PCP as needed Discussed symptoms that warrant emergent care in the ED.  Final Clinical Impressions(s) / UC Diagnoses   Final diagnoses:  Left flank pain  Suprapubic pain  Abdominal pain, epigastric  Epigastric abdominal pain  LUQ abdominal pain     Discharge Instructions      Although the imaging was read as normal, you do have some gas located in your Left upper abdomen, which may be contributing to some of your discomfort. Be sure to stay well hydrated. If you are feeling constipated, you may try an over the  counter stool softener such as miralax or colace.  Try to stick with a bland diet for the next few days as the pain may also be due to gastritis or a stomach ulcer, irritation of your stomach lining from spicy foods  you have recently eaten. Limit or avoid use of NSAIDs (iburpofen, naproxen, Motrin Advil Aleve) to limit irritation to your stomach.   Please call to schedule a follow up appointment with family medicine in 1 week if not improving, Call 911 or go to the hospital if symptoms worsening.     ED Prescriptions    Medication Sig Dispense Auth. Provider   omeprazole (PRILOSEC) 20 MG capsule Take 1 capsule (20 mg total) by mouth daily. 30 capsule Lurene Shadow, PA-C     Controlled Substance Prescriptions  Controlled Substance Registry consulted? Not Applicable   Rolla Plate 12/04/18 1028

## 2018-12-04 NOTE — ED Triage Notes (Signed)
States pain is in ULQ of Abd area since midnight, states no other symptoms

## 2018-12-04 NOTE — ED Notes (Signed)
Bed: BSW9 Expected date:  Expected time:  Means of arrival: Car Comments:

## 2018-12-05 ENCOUNTER — Telehealth: Payer: Self-pay | Admitting: Emergency Medicine

## 2018-12-05 LAB — COMPLETE METABOLIC PANEL WITH GFR
AG Ratio: 1.7 (calc) (ref 1.0–2.5)
ALT: 11 U/L (ref 6–29)
AST: 12 U/L (ref 10–30)
Albumin: 4.7 g/dL (ref 3.6–5.1)
Alkaline phosphatase (APISO): 95 U/L (ref 31–125)
BUN: 9 mg/dL (ref 7–25)
CO2: 26 mmol/L (ref 20–32)
Calcium: 9.6 mg/dL (ref 8.6–10.2)
Chloride: 104 mmol/L (ref 98–110)
Creat: 0.69 mg/dL (ref 0.50–1.10)
GFR, Est African American: 144 mL/min/{1.73_m2} (ref >=60)
GFR, Est Non African American: 124 mL/min/{1.73_m2} (ref >=60)
Globulin: 2.7 g/dL (calc) (ref 1.9–3.7)
Glucose, Bld: 87 mg/dL (ref 65–99)
Potassium: 3.7 mmol/L (ref 3.5–5.3)
Sodium: 138 mmol/L (ref 135–146)
Total Bilirubin: 0.3 mg/dL (ref 0.2–1.2)
Total Protein: 7.4 g/dL (ref 6.1–8.1)

## 2018-12-05 LAB — LIPASE: Lipase: 22 U/L (ref 7–60)

## 2018-12-05 NOTE — Telephone Encounter (Signed)
Labs are normal, Left message

## 2018-12-08 ENCOUNTER — Ambulatory Visit (INDEPENDENT_AMBULATORY_CARE_PROVIDER_SITE_OTHER): Payer: 59

## 2018-12-08 DIAGNOSIS — J309 Allergic rhinitis, unspecified: Secondary | ICD-10-CM | POA: Diagnosis not present

## 2018-12-09 MED FILL — FLUoxetine HCL 10 MG CAPS: 10 | 30 days supply | Qty: 60 | Fill #2

## 2018-12-23 ENCOUNTER — Ambulatory Visit (INDEPENDENT_AMBULATORY_CARE_PROVIDER_SITE_OTHER): Payer: 59

## 2018-12-23 DIAGNOSIS — J309 Allergic rhinitis, unspecified: Secondary | ICD-10-CM

## 2018-12-26 ENCOUNTER — Ambulatory Visit: Payer: 59 | Admitting: Family Medicine

## 2019-01-08 ENCOUNTER — Ambulatory Visit (INDEPENDENT_AMBULATORY_CARE_PROVIDER_SITE_OTHER): Payer: 59

## 2019-01-08 DIAGNOSIS — J309 Allergic rhinitis, unspecified: Secondary | ICD-10-CM | POA: Diagnosis not present

## 2019-01-12 ENCOUNTER — Ambulatory Visit (INDEPENDENT_AMBULATORY_CARE_PROVIDER_SITE_OTHER): Payer: 59

## 2019-01-12 DIAGNOSIS — J309 Allergic rhinitis, unspecified: Secondary | ICD-10-CM

## 2019-01-12 MED FILL — FLUoxetine HCL 10 MG CAPS: 10 | 30 days supply | Qty: 60 | Fill #3

## 2019-01-16 ENCOUNTER — Ambulatory Visit: Payer: 59 | Admitting: Family Medicine

## 2019-01-20 ENCOUNTER — Emergency Department (INDEPENDENT_AMBULATORY_CARE_PROVIDER_SITE_OTHER)
Admission: EM | Admit: 2019-01-20 | Discharge: 2019-01-20 | Disposition: A | Discharge status: Home / Self Care | Payer: 59 | Source: Home / Self Care

## 2019-01-20 ENCOUNTER — Other Ambulatory Visit: Payer: Self-pay

## 2019-01-20 ENCOUNTER — Emergency Department (INDEPENDENT_AMBULATORY_CARE_PROVIDER_SITE_OTHER): Payer: 59

## 2019-01-20 DIAGNOSIS — M546 Pain in thoracic spine: Secondary | ICD-10-CM

## 2019-01-20 DIAGNOSIS — S299XXA Unspecified injury of thorax, initial encounter: Secondary | ICD-10-CM | POA: Diagnosis not present

## 2019-01-20 DIAGNOSIS — R519 Headache, unspecified: Secondary | ICD-10-CM

## 2019-01-20 DIAGNOSIS — M419 Scoliosis, unspecified: Secondary | ICD-10-CM

## 2019-01-20 DIAGNOSIS — M25512 Pain in left shoulder: Secondary | ICD-10-CM

## 2019-01-20 MED ORDER — CYCLOBENZAPRINE HCL 5 MG PO TABS: 5.0000 mg | ORAL_TABLET | Freq: Two times a day (BID) | ORAL | 0 refills | Status: DC | PRN
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
## 2019-01-20 NOTE — Discharge Instructions (Addendum)
°  You may take 500mg  acetaminophen every 4-6 hours or in combination with ibuprofen 400-600mg  every 6-8 hours as needed for pain and inflammation.  Flexeril (cyclobenzaprine) is a muscle relaxer and may cause drowsiness. Do not drink alcohol, drive, or operate heavy machinery while taking.  Please call to schedule a follow up appointment with your primary care provider later this week if not improving.   Call 911 or go to the hospital if symptoms significantly worsening- worsening headache, weakness or numbness in arms or leg, other new concerning symptoms develop.
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
## 2019-01-22 ENCOUNTER — Other Ambulatory Visit: Payer: Self-pay

## 2019-01-22 ENCOUNTER — Ambulatory Visit (INDEPENDENT_AMBULATORY_CARE_PROVIDER_SITE_OTHER): Payer: 59 | Admitting: Family Medicine

## 2019-01-22 ENCOUNTER — Encounter: Payer: Self-pay | Admitting: Family Medicine

## 2019-01-22 VITALS — BP 106/69 | HR 95 | Ht 62.0 in | Wt 153.0 lb

## 2019-01-22 DIAGNOSIS — Z30431 Encounter for routine checking of intrauterine contraceptive device: Secondary | ICD-10-CM

## 2019-01-22 NOTE — Progress Notes (Signed)
   Subjective:   Patient Name: Alexis Dixon, female   DOB: July 09, 1996, 23 y.o.  MRN: [id number]  HPI Patient here for an IUD check.  She had the Liletta IUD placed 1 month ago.  She reports occasional spotting.   Review of Systems  Constitutional: Negative for fever and chills.  Gastrointestinal: Negative for abdominal pain.  Genitourinary: Negative for vaginal discharge, vaginal pain, pelvic pain and dyspareunia.        Objective:   Physical Exam  Constitutional: She appears well-developed and well-nourished.  HENT:  Head: Normocephalic and atraumatic.  Abdominal: Soft. There is no tenderness. There is no guarding.  Genitourinary: There is no rash, tenderness or lesion on the right labia. There is no rash, tenderness or lesion on the left labia. No erythema or tenderness in the vagina. No foreign body around the vagina. No signs of injury around the vagina. No vaginal discharge found.    Skin: Skin is warm and dry.  Psychiatric: She has a normal mood and affect. Her behavior is normal. Judgment and thought content normal.       Assessment & Plan:  1. IUD check up IUD in place.  Pt to call with any other problems.  Recheck in 1 year.

## 2019-01-26 ENCOUNTER — Ambulatory Visit (INDEPENDENT_AMBULATORY_CARE_PROVIDER_SITE_OTHER): Payer: 59

## 2019-01-26 DIAGNOSIS — J309 Allergic rhinitis, unspecified: Secondary | ICD-10-CM

## 2019-02-02 ENCOUNTER — Encounter: Payer: Self-pay | Admitting: Family Medicine

## 2019-02-03 ENCOUNTER — Ambulatory Visit (INDEPENDENT_AMBULATORY_CARE_PROVIDER_SITE_OTHER): Payer: 59

## 2019-02-03 DIAGNOSIS — J309 Allergic rhinitis, unspecified: Secondary | ICD-10-CM | POA: Diagnosis not present

## 2019-02-10 MED FILL — FLUoxetine HCL 10 MG CAPS: 10 | 30 days supply | Qty: 60 | Fill #4
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
## 2019-02-19 ENCOUNTER — Encounter: Payer: Self-pay | Admitting: Family Medicine

## 2019-02-19 ENCOUNTER — Ambulatory Visit (HOSPITAL_BASED_OUTPATIENT_CLINIC_OR_DEPARTMENT_OTHER)
Admission: RE | Admit: 2019-02-19 | Discharge: 2019-02-19 | Disposition: A | Discharge status: Home / Self Care | Payer: No Typology Code available for payment source | Source: Ambulatory Visit | Attending: Family Medicine | Admitting: Family Medicine

## 2019-02-19 ENCOUNTER — Ambulatory Visit (INDEPENDENT_AMBULATORY_CARE_PROVIDER_SITE_OTHER): Payer: 59

## 2019-02-19 ENCOUNTER — Other Ambulatory Visit: Payer: Self-pay

## 2019-02-19 ENCOUNTER — Ambulatory Visit: Payer: 59 | Admitting: Family Medicine

## 2019-02-19 VITALS — BP 110/76 | HR 62 | Temp 97.9°F | Resp 16 | Ht 62.0 in | Wt 154.0 lb

## 2019-02-19 DIAGNOSIS — J309 Allergic rhinitis, unspecified: Secondary | ICD-10-CM

## 2019-02-19 DIAGNOSIS — M542 Cervicalgia: Secondary | ICD-10-CM | POA: Diagnosis not present

## 2019-02-19 DIAGNOSIS — Z111 Encounter for screening for respiratory tuberculosis: Secondary | ICD-10-CM | POA: Diagnosis not present

## 2019-02-19 DIAGNOSIS — S060X0A Concussion without loss of consciousness, initial encounter: Secondary | ICD-10-CM
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
## 2019-02-21 LAB — QUANTIFERON-TB GOLD PLUS
Mitogen-NIL: 7.8 IU/mL
NIL: 0.03 IU/mL
QuantiFERON-TB Gold Plus: NEGATIVE
TB1-NIL: <0 IU/mL
TB2-NIL: <0 IU/mL

## 2019-02-26 ENCOUNTER — Ambulatory Visit (INDEPENDENT_AMBULATORY_CARE_PROVIDER_SITE_OTHER): Payer: 59

## 2019-02-26 DIAGNOSIS — J309 Allergic rhinitis, unspecified: Secondary | ICD-10-CM | POA: Diagnosis not present

## 2019-03-09 ENCOUNTER — Ambulatory Visit (INDEPENDENT_AMBULATORY_CARE_PROVIDER_SITE_OTHER): Payer: 59

## 2019-03-09 DIAGNOSIS — J309 Allergic rhinitis, unspecified: Secondary | ICD-10-CM

## 2019-03-16 ENCOUNTER — Ambulatory Visit (INDEPENDENT_AMBULATORY_CARE_PROVIDER_SITE_OTHER): Payer: 59

## 2019-03-16 DIAGNOSIS — J309 Allergic rhinitis, unspecified: Secondary | ICD-10-CM | POA: Diagnosis not present

## 2019-03-16 MED FILL — FLUoxetine HCL 10 MG CAPS: 10 | 30 days supply | Qty: 60 | Fill #5

## 2019-03-18 NOTE — Progress Notes (Signed)
EXP 03/18/20 
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
## 2019-03-22 ENCOUNTER — Other Ambulatory Visit: Payer: Self-pay | Admitting: Family Medicine
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
## 2019-03-24 DIAGNOSIS — J3089 Other allergic rhinitis: Secondary | ICD-10-CM | POA: Diagnosis not present

## 2019-03-26 ENCOUNTER — Ambulatory Visit (INDEPENDENT_AMBULATORY_CARE_PROVIDER_SITE_OTHER): Payer: 59

## 2019-03-26 DIAGNOSIS — J309 Allergic rhinitis, unspecified: Secondary | ICD-10-CM | POA: Diagnosis not present

## 2019-04-06 ENCOUNTER — Other Ambulatory Visit: Payer: Self-pay | Admitting: Family Medicine

## 2019-04-06 DIAGNOSIS — F410 Panic disorder [episodic paroxysmal anxiety] without agoraphobia: Secondary | ICD-10-CM

## 2019-04-07 MED ORDER — ALPRAZOLAM 0.25 MG PO TABS: 0.2500 mg | ORAL_TABLET | Freq: Two times a day (BID) | ORAL | 0 refills | Status: DC | PRN

## 2019-04-07 MED FILL — ALPRAZolam 0.25 MG TABS: 0.25 | 10 days supply | Qty: 20 | Fill #0

## 2019-04-09 ENCOUNTER — Ambulatory Visit: Payer: Self-pay

## 2019-04-09 ENCOUNTER — Ambulatory Visit (INDEPENDENT_AMBULATORY_CARE_PROVIDER_SITE_OTHER): Payer: 59 | Admitting: Family Medicine

## 2019-04-09 ENCOUNTER — Encounter: Payer: Self-pay | Admitting: Family Medicine

## 2019-04-09 ENCOUNTER — Other Ambulatory Visit: Payer: Self-pay

## 2019-04-09 VITALS — BP 100/66 | HR 88 | Temp 98.2°F | Resp 16 | Ht 62.5 in | Wt 162.9 lb

## 2019-04-09 DIAGNOSIS — J309 Allergic rhinitis, unspecified: Secondary | ICD-10-CM | POA: Diagnosis not present

## 2019-04-09 DIAGNOSIS — K219 Gastro-esophageal reflux disease without esophagitis: Secondary | ICD-10-CM | POA: Diagnosis not present

## 2019-04-09 DIAGNOSIS — L501 Idiopathic urticaria: Secondary | ICD-10-CM | POA: Diagnosis not present

## 2019-04-09 DIAGNOSIS — T7800XD Anaphylactic reaction due to unspecified food, subsequent encounter: Secondary | ICD-10-CM

## 2019-04-09 DIAGNOSIS — H1010 Acute atopic conjunctivitis, unspecified eye: Secondary | ICD-10-CM

## 2019-04-09 DIAGNOSIS — J3089 Other allergic rhinitis: Secondary | ICD-10-CM | POA: Diagnosis not present

## 2019-04-09 DIAGNOSIS — J302 Other seasonal allergic rhinitis: Secondary | ICD-10-CM | POA: Insufficient documentation

## 2019-04-09 DIAGNOSIS — J4531 Mild persistent asthma with (acute) exacerbation: Secondary | ICD-10-CM

## 2019-04-09 MED ORDER — SPACER/AERO-HOLDING CHAMBERS DEVI: 1 refills | Status: DC

## 2019-04-09 MED ORDER — FLUTICASONE PROPIONATE 50 MCG/ACT NA SUSP: NASAL | 5 refills | Status: DC

## 2019-04-09 MED ORDER — MONTELUKAST SODIUM 10 MG PO TABS: 10.0000 mg | ORAL_TABLET | Freq: Every day | ORAL | 5 refills | Status: DC

## 2019-04-09 MED ORDER — FLOVENT HFA 110 MCG/ACT IN AERO: INHALATION_SPRAY | RESPIRATORY_TRACT | 5 refills | Status: DC

## 2019-04-09 MED ORDER — OMEPRAZOLE 20 MG PO CPDR: DELAYED_RELEASE_CAPSULE | ORAL | 5 refills | Status: DC

## 2019-04-09 MED ORDER — PAZEO 0.7 % OP SOLN: 1.0000 [drp] | OPHTHALMIC | 5 refills | Status: DC | PRN

## 2019-04-09 MED ORDER — EPINEPHRINE 0.3 MG/0.3ML IJ SOAJ: 0.3000 mg | Freq: Once | INTRAMUSCULAR | 1 refills | Status: AC

## 2019-04-09 MED FILL — MICROCHAMBER: 1 days supply | Qty: 1 | Fill #0

## 2019-04-09 MED FILL — EPINEPHRINE 0.3 MG AUTO-INJ: 0.3 | 2 days supply | Qty: 2 | Fill #0

## 2019-04-09 MED FILL — FLOVENT HFA 110 MCG INHALER: 110 | 30 days supply | Qty: 12 | Fill #0

## 2019-04-09 MED FILL — FLUoxetine HCL 10 MG CAPS: 10 | 30 days supply | Qty: 60 | Fill #6

## 2019-04-09 MED FILL — OMEPRAZOLE 20 MG CAP: 20 | 34 days supply | Qty: 34 | Fill #0

## 2019-04-09 MED FILL — FLUTICASONE PROP 50 MCG SPR: 50 | 30 days supply | Qty: 16 | Fill #0

## 2019-04-09 MED FILL — MONTELUKAST SOD 10 MG TAB: 10 | 30 days supply | Qty: 30 | Fill #0

## 2019-04-09 NOTE — Patient Instructions (Addendum)
Asthma Begin montelukast 10 mg-take 1 tablet once a day for coughing or wheezing. If no improvement after 1-2 weeks, begin Flovent 110-2 puffs twice a day with a spacer to prevent cough or wheeze  Proventil 2 puffs every 4 hours if needed for wheezing or coughing spells. Use 2 puffs of of albuterol 5-15 minutes before exercise to reduce cough or wheeze For asthma flares, begin Flovent 110-2 puffs with a spacer twice a day for 2 weeks or until cough and wheeze free  Reflux Begin omeprazole 20 mg once a day for heartburn. This may decrease your chest tightness Begin dietary and lifestyle modifications as listed below   Allergic rhinitis Continue fluticasone 2 sprays per nostril once a day if needed for stuffy nose Continue allergen immunotherapy and have access to an epinephrine device Cetirizine 10 mg once a day as needed for a runny nose Consider saline nasal rinses as needed for nasal symptoms  Allergic conjunctivitis Begin Pazeo eye drops one drop in each eye once a day as needed for red, itchy eyes.   Urticaria Continue cetirizine -take 1 tablet once or twice a day as needed for itching or hives  Food allergy Avoid tree nuts. If you have an allergic reaction take Benadryl 50 mg  every 4 hours and if you have life-threatening symptoms inject  with EpiPen 0.3 mg  Call the clinic if this treatment plan is not working well for you  Follow up in 2 months or sooner if needed   Lifestyle Changes for Controlling GERD When you have GERD, stomach acid feels as if it's backing up toward your mouth. Whether or not you take medication to control your GERD, your symptoms can often be improved with lifestyle changes.   Raise Your Head  Reflux is more likely to strike when you're lying down flat, because stomach fluid can  flow backward more easily. Raising the head of your bed 4-6 inches can help. To do this:  Slide blocks or books under the legs at the head of your bed. Or, place a wedge  under  the mattress. Many foam stores can make a suitable wedge for you. The wedge  should run from your waist to the top of your head.  Don't just prop your head on several pillows. This increases pressure on your  stomach. It can make GERD worse.  Watch Your Eating Habits Certain foods may increase the acid in your stomach or relax the lower esophageal sphincter, making GERD more likely. It's best to avoid the following:  Coffee, tea, and carbonated drinks (with and without caffeine)  Fatty, fried, or spicy food  Mint, chocolate, onions, and tomatoes  Any other foods that seem to irritate your stomach or cause you pain  Relieve the Pressure  Eat smaller meals, even if you have to eat more often.  Don't lie down right after you eat. Wait a few hours for your stomach to empty.  Avoid tight belts and tight-fitting clothes.  Lose excess weight.  Tobacco and Alcohol  Avoid smoking tobacco and drinking alcohol. They can make GERD symptoms worse.

## 2019-04-09 NOTE — Progress Notes (Addendum)
[place] [address] Dept: [phone]  FOLLOW UP NOTE  Patient ID: Alexis Dixon, female    DOB: 09-01-1995  Age: 23 y.o. MRN: [id number] Date of Office Visit: 04/09/2019  Assessment  Chief Complaint: Asthma and Urticaria  HPI Alexis Dixon is a 23 year old female who presents to the clinic for a follow up visit. She was last seen in this clinic on 09/23/2017 by Dr. Shaune Leeks for evaluation of asthma, allergic rhinitis, allergic conjunctivitis, and food allergy to tree nuts. At today's visit, she reports her asthma has been poorly controlled with chest tightness and shortness of breath that began about 3-4 months ago and occurs mostly at night, in the morning, and if she spends a lot of time outside. She reports a dry cough that occurs mostly at night. She has previously taken montelukast with relief of asthma symptoms, however, she has not taken this for about 1 year. She continues to use her albuterol inhaler once a week with moderate relief of symptoms. She reports allergic rhinitis has not been well controlled with clear rhinorrhea and sneezing frequently. She continues levocetirizine once a day and is not currently using Flonase or nasal saline rinses. She reports allergen immunotherapy is going well with no adverse reactions. She reports a decrease in the symptoms of allergic rhinitis while continuing on allergen immunotherapy. Allergic conjunctivitis is reported as moderately well controlled with frequent occular pruritus for which she is using an over the counter eye drop with partial relief. She reports reflux has been poorly controlled with heartburn 2-3 days a week for which she is not currently using any medical intervention. Chronic urticaria is reported as well controlled with a daily antihistamine. She continues to avoid tree nuts and has had one accidental ingestion resulting in shortness of breath that resolved with albuterol and Benadryl. She has not used her  epinephrine device since her last visit to this clinic. Her current medications are listed in the chart.    Drug Allergies:  Allergies  Allergen Reactions   Other Swelling    Tree Nuts     Physical Exam: BP 100/66 (BP Location: Left Arm, Patient Position: Sitting, Cuff Size: Normal)    Pulse 88    Temp 98.2 F (36.8 C) (Oral)    Resp 16    Ht 5' 2.5" (1.588 m)    Wt 162 lb 14.7 oz (73.9 kg)    SpO2 100%    BMI 29.32 kg/m    Physical Exam Vitals signs reviewed.  Constitutional:      Appearance: Normal appearance.  HENT:     Head: Normocephalic and atraumatic.     Right Ear: Tympanic membrane normal.     Left Ear: Tympanic membrane normal.     Nose:     Comments: Bilateral nares slightly erythematous with clear nasal drainage noted. Pharynx normal. Ears normal. Eyes normal.    Mouth/Throat:     Pharynx: Oropharynx is clear.  Eyes:     Conjunctiva/sclera: Conjunctivae normal.  Neck:     Musculoskeletal: Normal range of motion and neck supple.  Cardiovascular:     Rate and Rhythm: Normal rate and regular rhythm.     Heart sounds: Normal heart sounds. No murmur.  Pulmonary:     Effort: Pulmonary effort is normal.     Breath sounds: Normal breath sounds.     Comments: Lungs clear to auscultation Musculoskeletal: Normal range of motion.  Skin:    General: Skin is warm  and dry.     Comments: No hives or rash noted.   Neurological:     Mental Status: She is alert and oriented to person, place, and time.  Psychiatric:        Mood and Affect: Mood normal.        Behavior: Behavior normal.        Thought Content: Thought content normal.        Judgment: Judgment normal.     Diagnostics: FVC 3.35, FEV1 2.91. Predicted FVC 3.17, predicted FEV1 2.81. Spirometry indicates normal ventilatory function.    Assessment and Plan: 1. Mild persistent asthma with acute exacerbation   2. Seasonal and perennial allergic rhinitis   3. Seasonal allergic conjunctivitis   4. Anaphylactic  shock due to food, subsequent encounter   5. Chronic idiopathic urticaria   6. Gastroesophageal reflux disease, esophagitis presence not specified     Meds ordered this encounter  Medications   EPINEPHrine (EPIPEN 2-PAK) 0.3 mg/0.3 mL IJ SOAJ injection    Sig: Inject 0.3 mLs (0.3 mg total) into the muscle once for 1 dose.    Dispense:  2 each    Refill:  1   montelukast (SINGULAIR) 10 MG tablet    Sig: Take 1 tablet (10 mg total) by mouth at bedtime.    Dispense:  30 tablet    Refill:  5   fluticasone (FLOVENT HFA) 110 MCG/ACT inhaler    Sig: 2 puffs twice daily with spacer to prevent coughing or wheezing.    Dispense:  1 Inhaler    Refill:  5   omeprazole (PRILOSEC) 20 MG capsule    Sig: Take 1 capsule  Once day for heartburn.    Dispense:  34 capsule    Refill:  5   Olopatadine HCl (PAZEO) 0.7 % SOLN    Sig: Place 1 drop into both eyes as needed (for itchy eyes).    Dispense:  2.5 mL    Refill:  5   fluticasone (FLONASE) 50 MCG/ACT nasal spray    Sig: 2 sprays per nostril once daily if needed for stuffy nose.    Dispense:  16 g    Refill:  5   Spacer/Aero-Holding Chambers DEVI    Sig: Use as directed    Dispense:  1 Device    Refill:  1    Patient Instructions  Asthma Begin montelukast 10 mg-take 1 tablet once a day for coughing or wheezing. If no improvement after 1-2 weeks, begin Flovent 110-2 puffs twice a day with a spacer to prevent cough or wheeze  Proventil 2 puffs every 4 hours if needed for wheezing or coughing spells. Use 2 puffs of of albuterol 5-15 minutes before exercise to reduce cough or wheeze For asthma flares, begin Flovent 110-2 puffs with a spacer twice a day for 2 weeks or until cough and wheeze free  Reflux Begin omeprazole 20 mg once a day for heartburn. Begin dietary and lifestyle modifications as listed below   Allergic rhinitis Continue fluticasone 2 sprays per nostril once a day if needed for stuffy nose Continue allergen  immunotherapy and have access to an epinephrine device Cetirizine 10 mg once a day as needed for a runny nose Consider saline nasal rinses as needed for nasal symptoms  Allergic conjunctivitis Begin Pazeo eye drops one drop in each eye once a day as needed for red, itchy eyes.   Urticaria Continue cetirizine -take 1 tablet once or twice a day as needed for itching  Food allergy Avoid tree nuts. If you have an allergic reaction take Benadryl 50 mg  every 4 hours and if you have life-threatening symptoms inject  with EpiPen 0.3 mg  Call the clinic if this treatment plan is not working well for you  Follow up in 2 months or sooner if needed   Return in about 2 months (around 06/09/2019), or if symptoms worsen or fail to improve.    Thank you for the opportunity to care for this patient.  Please do not hesitate to contact me with questions.  Thermon LeylandAnne Kenric Ginger, FNP [facility]  _________________________________________________  I have provided oversight concerning [facility]'s evaluation and treatment of this patient's health issues addressed during today's encounter.  I agree with the assessment and therapeutic plan as outlined in the note.   Signed,   R Jorene Guestarter Bobbitt, MD

## 2019-04-10 ENCOUNTER — Telehealth: Payer: Self-pay | Admitting: *Deleted

## 2019-04-10 MED ORDER — OLOPATADINE HCL 0.2 % OP SOLN: 1.0000 [drp] | Freq: Every day | OPHTHALMIC | 5 refills | Status: DC | PRN

## 2019-04-10 MED FILL — OLOPATADINE HCL 0.2 % SOLN: 0.2 | 25 days supply | Qty: 3 | Fill #0

## 2019-04-10 NOTE — Telephone Encounter (Signed)
Pataday one drop in each eye once a day as needed for red, itchy eyes please. Thank you

## 2019-04-10 NOTE — Telephone Encounter (Signed)
Received PA request for pazeo. Preferred alternatives are optivar, pataday, patanol. Please advise.

## 2019-04-30 ENCOUNTER — Ambulatory Visit (INDEPENDENT_AMBULATORY_CARE_PROVIDER_SITE_OTHER): Payer: 59

## 2019-04-30 DIAGNOSIS — J309 Allergic rhinitis, unspecified: Secondary | ICD-10-CM | POA: Diagnosis not present

## 2019-05-18 ENCOUNTER — Other Ambulatory Visit: Payer: Self-pay | Admitting: Family Medicine

## 2019-05-18 ENCOUNTER — Ambulatory Visit (INDEPENDENT_AMBULATORY_CARE_PROVIDER_SITE_OTHER): Payer: 59

## 2019-05-18 DIAGNOSIS — F411 Generalized anxiety disorder: Secondary | ICD-10-CM

## 2019-05-18 DIAGNOSIS — J309 Allergic rhinitis, unspecified: Secondary | ICD-10-CM | POA: Diagnosis not present

## 2019-05-18 MED FILL — MONTELUKAST SOD 10 MG TAB: 10 | 30 days supply | Qty: 30 | Fill #1

## 2019-05-19 MED FILL — FLUoxetine HCL 10 MG CAPS: 10 | 30 days supply | Qty: 60 | Fill #0

## 2019-06-10 ENCOUNTER — Ambulatory Visit (INDEPENDENT_AMBULATORY_CARE_PROVIDER_SITE_OTHER): Payer: 59

## 2019-06-10 DIAGNOSIS — J309 Allergic rhinitis, unspecified: Secondary | ICD-10-CM

## 2019-07-02 ENCOUNTER — Ambulatory Visit (INDEPENDENT_AMBULATORY_CARE_PROVIDER_SITE_OTHER): Payer: 59

## 2019-07-02 DIAGNOSIS — J309 Allergic rhinitis, unspecified: Secondary | ICD-10-CM

## 2019-07-14 ENCOUNTER — Ambulatory Visit (INDEPENDENT_AMBULATORY_CARE_PROVIDER_SITE_OTHER): Payer: 59

## 2019-07-14 DIAGNOSIS — J309 Allergic rhinitis, unspecified: Secondary | ICD-10-CM

## 2019-07-14 MED FILL — FLUoxetine HCL 10 MG CAPS: 10 | 30 days supply | Qty: 60 | Fill #1

## 2019-07-14 MED FILL — OMEPRAZOLE 20 MG CAP: 20 | 34 days supply | Qty: 34 | Fill #1

## 2019-07-30 ENCOUNTER — Ambulatory Visit (INDEPENDENT_AMBULATORY_CARE_PROVIDER_SITE_OTHER): Payer: 59

## 2019-07-30 DIAGNOSIS — J309 Allergic rhinitis, unspecified: Secondary | ICD-10-CM | POA: Diagnosis not present

## 2019-08-03 ENCOUNTER — Ambulatory Visit (INDEPENDENT_AMBULATORY_CARE_PROVIDER_SITE_OTHER): Payer: 59

## 2019-08-03 DIAGNOSIS — J309 Allergic rhinitis, unspecified: Secondary | ICD-10-CM
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
## 2019-10-05 NOTE — Patient Instructions (Addendum)
It was great to see you again today- I will be in touch with your labs We will change you from fluoxetine to wellbutrin- start with one dose am only for 3 days, then increase to twice a day  Stop the fluoxetine and then start wellbutrin the next day Please let me know if you are getting worse or not feeling better in the next few weeks  Please try an OTC sleep aid such as melatonin or Unisom as needed for better sleep

## 2019-10-06 ENCOUNTER — Other Ambulatory Visit: Payer: Self-pay

## 2019-10-07 ENCOUNTER — Other Ambulatory Visit: Payer: Self-pay

## 2019-10-07 ENCOUNTER — Ambulatory Visit: Payer: 59 | Admitting: Family Medicine

## 2019-10-07 ENCOUNTER — Encounter: Payer: Self-pay | Admitting: Family Medicine

## 2019-10-07 VITALS — BP 108/70 | HR 75 | Temp 96.7°F | Resp 16 | Ht 62.5 in | Wt 180.0 lb

## 2019-10-07 DIAGNOSIS — Z131 Encounter for screening for diabetes mellitus: Secondary | ICD-10-CM | POA: Diagnosis not present

## 2019-10-07 DIAGNOSIS — Z114 Encounter for screening for human immunodeficiency virus [HIV]: Secondary | ICD-10-CM | POA: Diagnosis not present

## 2019-10-07 DIAGNOSIS — Z13220 Encounter for screening for lipoid disorders: Secondary | ICD-10-CM | POA: Diagnosis not present

## 2019-10-07 DIAGNOSIS — F320 Major depressive disorder, single episode, mild: Secondary | ICD-10-CM | POA: Diagnosis not present

## 2019-10-07 DIAGNOSIS — F411 Generalized anxiety disorder: Secondary | ICD-10-CM | POA: Diagnosis not present

## 2019-10-07 DIAGNOSIS — Z1329 Encounter for screening for other suspected endocrine disorder: Secondary | ICD-10-CM | POA: Diagnosis not present

## 2019-10-07 DIAGNOSIS — R5383 Other fatigue: Secondary | ICD-10-CM

## 2019-10-07 DIAGNOSIS — F410 Panic disorder [episodic paroxysmal anxiety] without agoraphobia: Secondary | ICD-10-CM

## 2019-10-07 LAB — COMPREHENSIVE METABOLIC PANEL WITH GFR
ALT: 20 U/L (ref 0–35)
AST: 18 U/L (ref 0–37)
Albumin: 4.5 g/dL (ref 3.5–5.2)
Alkaline Phosphatase: 114 U/L (ref 39–117)
BUN: 8 mg/dL (ref 6–23)
CO2: 30 meq/L (ref 19–32)
Calcium: 10.2 mg/dL (ref 8.4–10.5)
Chloride: 103 meq/L (ref 96–112)
Creatinine, Ser: 0.75 mg/dL (ref 0.40–1.20)
GFR: 117.03 mL/min
Glucose, Bld: 75 mg/dL (ref 70–99)
Potassium: 4.3 meq/L (ref 3.5–5.1)
Sodium: 139 meq/L (ref 135–145)
Total Bilirubin: 0.4 mg/dL (ref 0.2–1.2)
Total Protein: 7.4 g/dL (ref 6.0–8.3)

## 2019-10-07 LAB — TSH: TSH: 2.91 u[IU]/mL (ref 0.35–4.50)

## 2019-10-07 LAB — LIPID PANEL
Cholesterol: 117 mg/dL (ref 0–200)
HDL: 48.2 mg/dL (ref >39.00)
LDL Cholesterol: 61 mg/dL (ref 0–99)
NonHDL: 68.99
Total CHOL/HDL Ratio: 2
Triglycerides: 38 mg/dL (ref 0.0–149.0)
VLDL: 7.6 mg/dL (ref 0.0–40.0)

## 2019-10-07 LAB — CBC
HCT: 41.5 % (ref 36.0–46.0)
Hemoglobin: 13.6 g/dL (ref 12.0–15.0)
MCHC: 32.9 g/dL (ref 30.0–36.0)
MCV: 91.3 fl (ref 78.0–100.0)
Platelets: 318 K/uL (ref 150.0–400.0)
RBC: 4.54 Mil/uL (ref 3.87–5.11)
RDW: 14.6 % (ref 11.5–15.5)
WBC: 5.2 K/uL (ref 4.0–10.5)

## 2019-10-07 LAB — HEMOGLOBIN A1C: Hgb A1c MFr Bld: 5.8 % (ref 4.6–6.5)

## 2019-10-07 LAB — POCT URINE PREGNANCY: Preg Test, Ur: NEGATIVE

## 2019-10-07 MED ORDER — ALPRAZOLAM 0.25 MG PO TABS: 0.2500 mg | ORAL_TABLET | Freq: Two times a day (BID) | ORAL | 0 refills | Status: DC | PRN

## 2019-10-07 MED ORDER — BUPROPION HCL ER (SR) 150 MG PO TB12: 150.0000 mg | ORAL_TABLET | Freq: Two times a day (BID) | ORAL | 6 refills | Status: DC

## 2019-10-07 MED FILL — BUPROPION HCL ER (SR) 150 M: 150 | 30 days supply | Qty: 60 | Fill #0

## 2019-10-07 MED FILL — ALPRAZolam 0.25 MG TABS: 0.25 | 10 days supply | Qty: 20 | Fill #0

## 2019-10-08 LAB — HIV ANTIBODY (ROUTINE TESTING W REFLEX): HIV 1&2 Ab, 4th Generation: NONREACTIVE

## 2019-10-10 IMAGING — DX DG ABDOMEN ACUTE W/ 1V CHEST
3 series · 3 of 3 positions shown · non-contrast
Comparison: None.

CLINICAL DATA: Left upper quadrant pain.

EXAM:
DG ABDOMEN ACUTE W/ 1V CHEST

[chest pa]
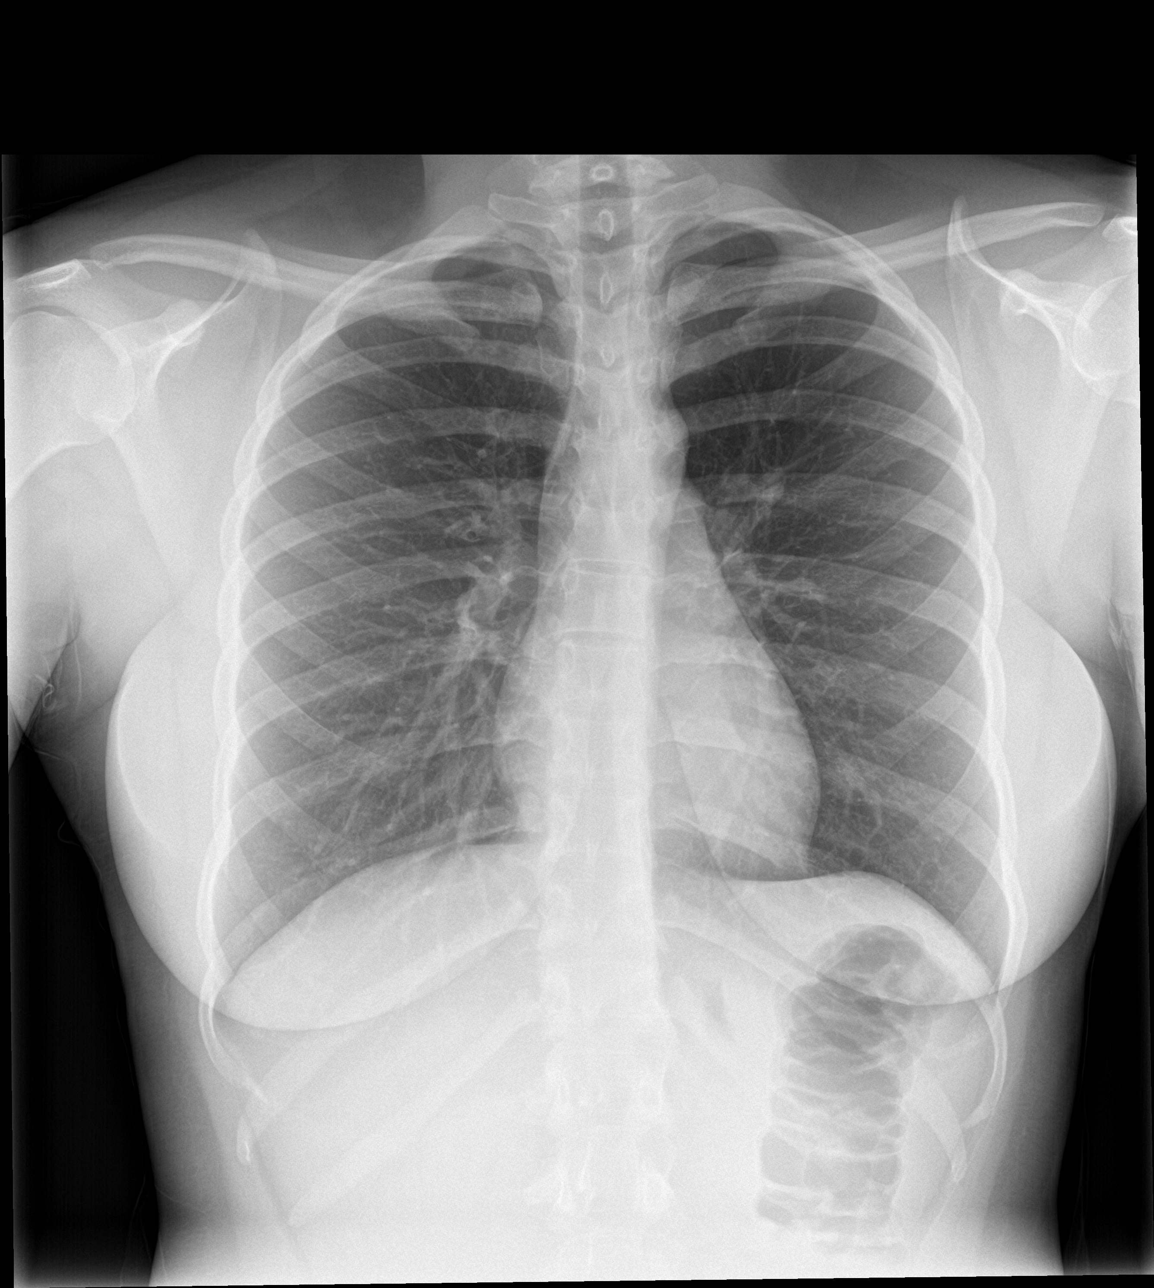

[abdomen erect]
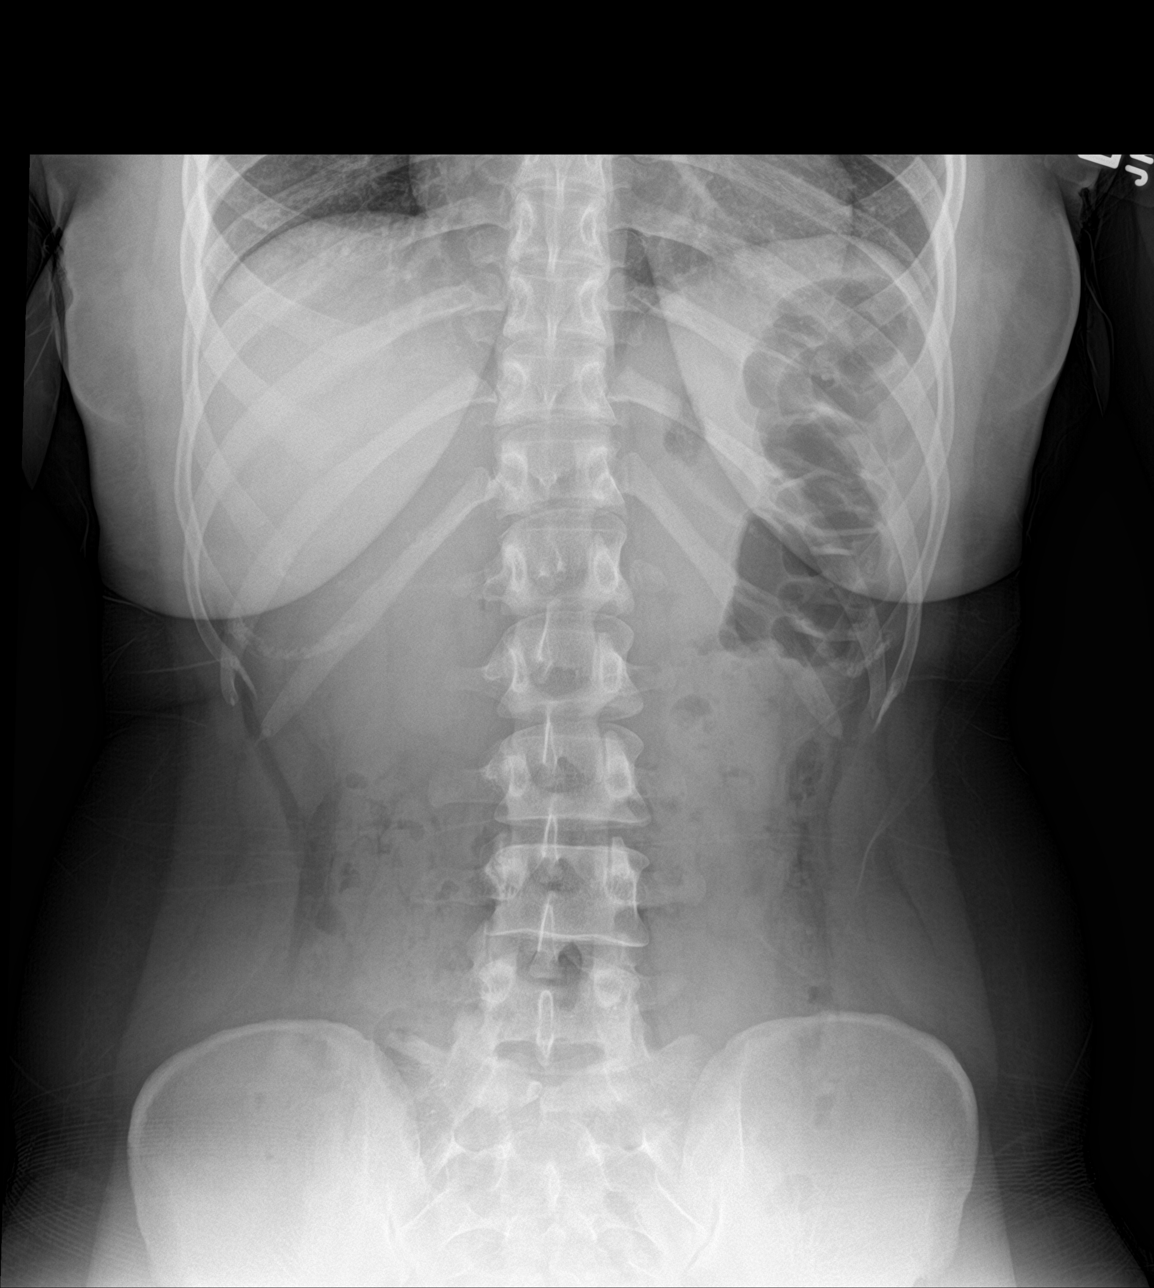

[abdomen supine]
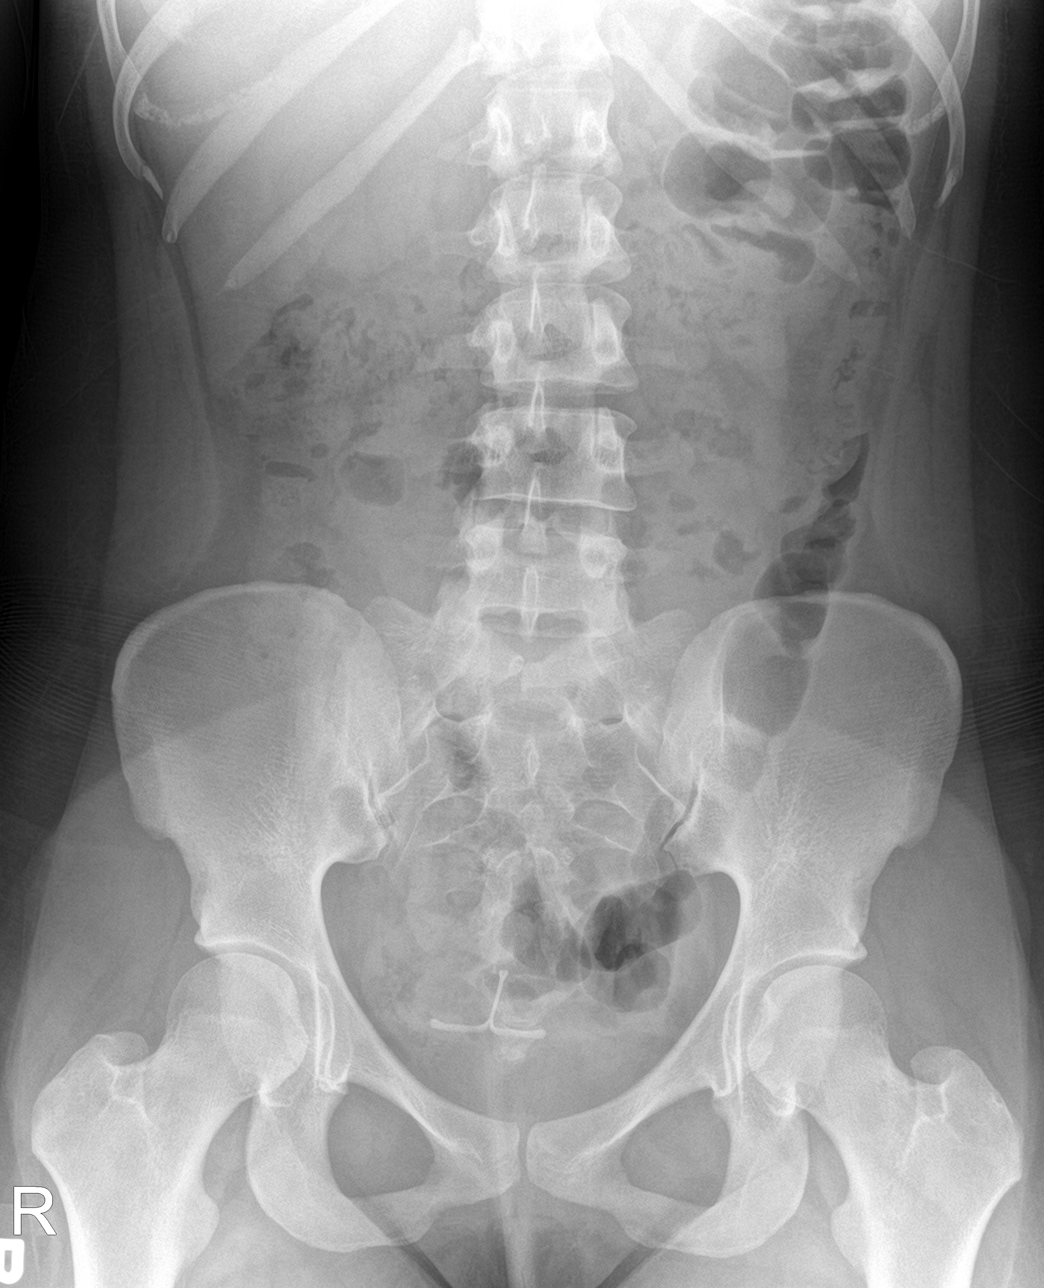

[3 of 3 positions shown; findings below may reference images not displayed]

FINDINGS: There is no evidence of dilated bowel loops or free intraperitoneal
air. No radiopaque calculi or other significant radiographic
abnormality is seen. Heart size and mediastinal contours are within
normal limits. Both lungs are clear. IUD in place in the central
pelvis.
IMPRESSION: Negative abdominal radiographs.  No acute cardiopulmonary disease.

## 2019-11-17 MED FILL — BUPROPION HCL ER (SR) 150 M: 150 | 30 days supply | Qty: 60 | Fill #1

## 2019-11-17 MED FILL — OMEPRAZOLE 20 MG CAP: 20 | 30 days supply | Qty: 30 | Fill #2

## 2019-11-17 MED FILL — MONTELUKAST SOD 10 MG TAB: 10 | 30 days supply | Qty: 30 | Fill #2

## 2019-12-23 MED FILL — MONTELUKAST SOD 10 MG TAB: 10 | 30 days supply | Qty: 30 | Fill #3

## 2019-12-23 MED FILL — OMEPRAZOLE 20 MG CAP: 20 | 30 days supply | Qty: 30 | Fill #3

## 2019-12-25 MED FILL — BUPROPION HCL ER (SR) 150 M: 150 | 30 days supply | Qty: 60 | Fill #2

## 2019-12-30 ENCOUNTER — Encounter: Payer: 59 | Admitting: Family Medicine

## 2019-12-30 DIAGNOSIS — R7303 Prediabetes: Secondary | ICD-10-CM | POA: Insufficient documentation

## 2019-12-30 NOTE — Progress Notes (Signed)
[place] at [facility] [address], [place], [place] [address] [phone]  Date:  12/31/2019   Name:  Alexis Dixon   DOB:  June 10, 1996   MRN:  [id number]  PCP:  Darreld Mclean, MD    Chief Complaint: Annual Exam   History of Present Illness:  Alexis Dixon is a 24 y.o. very pleasant female patient who presents with the following:  Pt with history of asthma and allergies Here today for a CPE and immunization update Last seen by myself in February -at that time she was having more difficulty with depression.  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  Advised her that she is technically up-to-date on Tdap, although an extra vaccine would not harm her.  She would like to get this shot as she thinks it is required  We will also get a QuantiFERON gold TB screening  Recent labs showed pre-diabetes - ow normal  She is a non-smoker, does not drink to excess  Lab Results  Component Value Date   HGBA1C 5.8 10/07/2019     Patient Active Problem List   Diagnosis Date Noted  . Pre-diabetes 12/30/2019  . Seasonal and perennial allergic rhinitis 04/09/2019  . Chronic idiopathic urticaria 04/09/2019  . Gastroesophageal reflux disease 04/09/2019  . Mild intermittent asthma without complication [id number]/[id number]  . Dermographia 09/23/2017  . Wheezing 11/29/2016  . Other seasonal allergic rhinitis 01/24/2016  . Anaphylactic shock due to adverse food reaction 01/24/2016  . Seasonal allergic conjunctivitis 01/24/2016  . Headache(784.0) 07/05/2013    Past Medical  History:  Diagnosis Date  . Allergy    Tree Nuts ; Claritin D  . Asthma    childhood; Albuterol and Singulair; no hospitalizations and ED visits.  . Eczema   . UKGURKYH(062.3)     Past Surgical History:  Procedure Laterality Date  . WISDOM TOOTH EXTRACTION      Social History   Tobacco Use  . Smoking status: Never Smoker  . Smokeless tobacco: Never Used  Substance Use Topics  . Alcohol use: Yes    Comment: socialy  . Drug use: No    Family History  Problem Relation Age of Onset  . Diabetes Mother   . Hyperlipidemia Maternal Grandmother   . Hypertension Maternal Grandmother   . Diabetes Maternal Grandmother   . Cancer Maternal Grandmother        breast  . Asthma Paternal Grandmother   . Asthma Sister   . Eczema Maternal Grandfather     Allergies  Allergen Reactions  . Other Swelling    Tree Nuts     Medication list has been reviewed and updated.  Current Outpatient Medications on File Prior to Visit  Medication Sig Dispense Refill  . Adapalene 0.3 % gel Apply topically at bedtime 45 g 6  . albuterol (PROVENTIL HFA;VENTOLIN HFA) 108 (90 Base) MCG/ACT inhaler Inhale 2 puffs into the lungs every 6 (six) hours as needed for wheezing or shortness of breath. Reported on 08/16/2015 18 g 1  .  ALPRAZolam (XANAX) 0.25 MG tablet Take 1 tablet (0.25 mg total) by mouth 2 (two) times daily as needed (panic attack). 20 tablet 0  . buPROPion (WELLBUTRIN SR) 150 MG 12 hr tablet Take 1 tablet (150 mg total) by mouth 2 (two) times daily. 60 tablet 6  . fluticasone (FLONASE) 50 MCG/ACT nasal spray 2 sprays per nostril once daily if needed for stuffy nose. 16 g 5  . fluticasone (FLOVENT HFA) 110 MCG/ACT inhaler 2 puffs twice daily with spacer to prevent coughing or wheezing. 1 Inhaler 5  . levocetirizine (XYZAL) 5 MG tablet TAKE 1 TABLET BY MOUTH EVERY EVENING.*NEED OFFICE VISIT FOR ADDITIONAL REFILLS PER MD* 30 tablet 0  . montelukast (SINGULAIR) 10 MG tablet Take 1 tablet (10 mg  total) by mouth at bedtime. 30 tablet 5  . NON FORMULARY Receives immunotherapy every week and at 0.50 every 2 weeks    . omeprazole (PRILOSEC) 20 MG capsule Take 1 capsule (20 mg total) by mouth daily. 30 capsule 0  . Spacer/Aero-Holding Rudean Curt Use as directed 1 Device 1  . SUMAtriptan (IMITREX) 100 MG tablet Take 1 tablet (100 mg total) by mouth every 2 (two) hours as needed for migraine. May repeat in 2 hours if headache persists or recurs. 8 tablet 5   No current facility-administered medications on file prior to visit.    Review of Systems:  As per HPI- otherwise negative.   Physical Examination: Vitals:   12/31/19 1354  BP: 106/72  Pulse: 92  Resp: 16  Temp: 98.2 F (36.8 C)  SpO2: 98%   Vitals:   12/31/19 1354  Weight: 172 lb (78 kg)  Height: 5' 2.5" (1.588 m)   Body mass index is 30.96 kg/m. Ideal Body Weight: Weight in (lb) to have BMI = 25: 138.6  GEN: no acute distress. HEENT: Atraumatic, Normocephalic.  Ears and Nose: No external deformity. CV: RRR, No M/G/R. No JVD. No thrill. No extra heart sounds. PULM: CTA B, no wheezes, crackles, rhonchi. No retractions. No resp. distress. No accessory muscle use. ABD: S, NT, ND, +BS. No rebound. No HSM. EXTR: No c/c/e PSYCH: Normally interactive. Conversant.    Assessment and Plan: Physical exam  Pre-diabetes  Screening examination for pulmonary tuberculosis - Plan: QuantiFERON-TB Gold Plus  Immunization due - Plan: Tdap vaccine greater than or equal to 7yo IM  Complete physical exam today.  Updated Tdap per patient, labs are up-to-date.  Filled out paperwork for [employer]-we will supply results of QuantiFERON gold when it comes back This visit occurred during the SARS-CoV-2 public health emergency.  Safety protocols were in place, including screening questions prior to the visit, additional usage of staff PPE, and extensive cleaning of exam room while observing appropriate contact  time as indicated for disinfecting solutions.   Marland Kitchendiscussed diet, exercise, tobacco and alcohol avoidance  Signed Abbe Amsterdam, MD

## 2019-12-31 ENCOUNTER — Other Ambulatory Visit: Payer: Self-pay

## 2019-12-31 ENCOUNTER — Ambulatory Visit (INDEPENDENT_AMBULATORY_CARE_PROVIDER_SITE_OTHER): Payer: 59 | Admitting: Family Medicine

## 2019-12-31 VITALS — BP 106/72 | HR 92 | Temp 98.2°F | Resp 16 | Ht 62.5 in | Wt 172.0 lb

## 2019-12-31 DIAGNOSIS — R7303 Prediabetes: Secondary | ICD-10-CM | POA: Diagnosis not present

## 2019-12-31 DIAGNOSIS — Z0000 Encounter for general adult medical examination without abnormal findings: Secondary | ICD-10-CM | POA: Diagnosis not present

## 2019-12-31 DIAGNOSIS — Z23 Encounter for immunization: Secondary | ICD-10-CM | POA: Diagnosis not present

## 2019-12-31 DIAGNOSIS — Z111 Encounter for screening for respiratory tuberculosis: Secondary | ICD-10-CM

## 2019-12-31 NOTE — Patient Instructions (Signed)
It was a pleasure to see you in the office today, I will be in touch with your tuberculosis blood test as soon as possible.  You can print this out and turn in with the rest of your paperwork  You got your tetanus booster today   Health Maintenance, Female Adopting a healthy lifestyle and getting preventive care are important in promoting health and wellness. Ask your health care provider about:  The right schedule for you to have regular tests and exams.  Things you can do on your own to prevent diseases and keep yourself healthy. What should I know about diet, weight, and exercise? Eat a healthy diet   Eat a diet that includes plenty of vegetables, fruits, low-fat dairy products, and lean protein.  Do not eat a lot of foods that are high in solid fats, added sugars, or sodium. Maintain a healthy weight Body mass index (BMI) is used to identify weight problems. It estimates body fat based on height and weight. Your health care provider can help determine your BMI and help you achieve or maintain a healthy weight. Get regular exercise Get regular exercise. This is one of the most important things you can do for your health. Most adults should:  Exercise for at least 150 minutes each week. The exercise should increase your heart rate and make you sweat (moderate-intensity exercise).  Do strengthening exercises at least twice a week. This is in addition to the moderate-intensity exercise.  Spend less time sitting. Even light physical activity can be beneficial. Watch cholesterol and blood lipids Have your blood tested for lipids and cholesterol at 22 years of age, then have this test every 5 years. Have your cholesterol levels checked more often if:  Your lipid or cholesterol levels are high.  You are older than 22 years of age.  You are at high risk for heart disease. What should I know about cancer screening? Depending on your health history and family history, you may need to  have cancer screening at various ages. This may include screening for:  Breast cancer.  Cervical cancer.  Colorectal cancer.  Skin cancer.  Lung cancer. What should I know about heart disease, diabetes, and high blood pressure? Blood pressure and heart disease  High blood pressure causes heart disease and increases the risk of stroke. This is more likely to develop in people who have high blood pressure readings, are of African descent, or are overweight.  Have your blood pressure checked: ? Every 3-5 years if you are 11-42 years of age. ? Every year if you are 85 years old or older. Diabetes Have regular diabetes screenings. This checks your fasting blood sugar level. Have the screening done:  Once every three years after age 11 if you are at a normal weight and have a low risk for diabetes.  More often and at a younger age if you are overweight or have a high risk for diabetes. What should I know about preventing infection? Hepatitis B If you have a higher risk for hepatitis B, you should be screened for this virus. Talk with your health care provider to find out if you are at risk for hepatitis B infection. Hepatitis C Testing is recommended for:  Everyone born from [birth year] through 1963.  Anyone with known risk factors for hepatitis C. Sexually transmitted infections (STIs)  Get screened for STIs, including gonorrhea and chlamydia, if: ? You are sexually active and are younger than 22 years of age. ? You are older  than 22 years of age and your health care provider tells you that you are at risk for this type of infection. ? Your sexual activity has changed since you were last screened, and you are at increased risk for chlamydia or gonorrhea. Ask your health care provider if you are at risk.  Ask your health care provider about whether you are at high risk for HIV. Your health care provider may recommend a prescription medicine to help prevent HIV infection. If you choose to  take medicine to prevent HIV, you should first get tested for HIV. You should then be tested every 3 months for as long as you are taking the medicine. Pregnancy  If you are about to stop having your period (premenopausal) and you may become pregnant, seek counseling before you get pregnant.  Take 400 to 800 micrograms (mcg) of folic acid every day if you become pregnant.  Ask for birth control (contraception) if you want to prevent pregnancy. Osteoporosis and menopause Osteoporosis is a disease in which the bones lose minerals and strength with aging. This can result in bone fractures. If you are 57 years old or older, or if you are at risk for osteoporosis and fractures, ask your health care provider if you should:  Be screened for bone loss.  Take a calcium or vitamin D supplement to lower your risk of fractures.  Be given hormone replacement therapy (HRT) to treat symptoms of menopause. Follow these instructions at home: Lifestyle  Do not use any products that contain nicotine or tobacco, such as cigarettes, e-cigarettes, and chewing tobacco. If you need help quitting, ask your health care provider.  Do not use street drugs.  Do not share needles.  Ask your health care provider for help if you need support or information about quitting drugs. Alcohol use  Do not drink alcohol if: ? Your health care provider tells you not to drink. ? You are pregnant, may be pregnant, or are planning to become pregnant.  If you drink alcohol: ? Limit how much you use to 0-1 drink a day. ? Limit intake if you are breastfeeding.  Be aware of how much alcohol is in your drink. In the [place] one drink equals one 12 oz bottle of beer (355 mL), one 5 oz glass of wine (148 mL), or one 1 oz glass of hard liquor (44 mL). General instructions  Schedule regular health, dental, and eye exams.  Stay current with your vaccines.  Tell your health care provider if: ? You often feel depressed. ? You  have ever been abused or do not feel safe at home. Summary  Adopting a healthy lifestyle and getting preventive care are important in promoting health and wellness.  Follow your health care provider's instructions about healthy diet, exercising, and getting tested or screened for diseases.  Follow your health care provider's instructions on monitoring your cholesterol and blood pressure. This information is not intended to replace advice given to you by your health care provider. Make sure you discuss any questions you have with your health care provider. Document Revised: 08/06/2018 Document Reviewed: 08/06/2018 Elsevier Patient Education  2020 [employer].

## 2020-01-02 LAB — QUANTIFERON-TB GOLD PLUS
Mitogen-NIL: >10 IU/mL
NIL: 0.03 IU/mL
QuantiFERON-TB Gold Plus: NEGATIVE
TB1-NIL: 0.01 IU/mL
TB2-NIL: 0.01 IU/mL

## 2020-02-18 MED FILL — BUPROPION HCL ER (SR) 150 M: 150 | 30 days supply | Qty: 60 | Fill #3

## 2020-04-08 ENCOUNTER — Other Ambulatory Visit: Payer: Self-pay | Admitting: Family Medicine

## 2020-04-08 DIAGNOSIS — F410 Panic disorder [episodic paroxysmal anxiety] without agoraphobia: Secondary | ICD-10-CM

## 2020-04-08 MED ORDER — ALPRAZOLAM 0.25 MG PO TABS: 0.2500 mg | ORAL_TABLET | Freq: Two times a day (BID) | ORAL | 0 refills | Status: DC | PRN

## 2020-04-08 MED FILL — ALPRAZolam 0.25 MG TABS: 0.25 | 10 days supply | Qty: 20 | Fill #0

## 2020-04-08 MED FILL — BUPROPION HCL ER (SR) 150 M: 150 | 30 days supply | Qty: 60 | Fill #4

## 2020-04-08 MED FILL — MONTELUKAST SOD 10 MG TAB: 10 | 30 days supply | Qty: 30 | Fill #0

## 2020-04-08 NOTE — Telephone Encounter (Signed)
Courtesy refill sent  

## 2020-04-14 DIAGNOSIS — Z20828 Contact with and (suspected) exposure to other viral communicable diseases: Secondary | ICD-10-CM | POA: Diagnosis not present

## 2020-05-23 DIAGNOSIS — Z03818 Encounter for observation for suspected exposure to other biological agents ruled out: Secondary | ICD-10-CM | POA: Diagnosis not present

## 2020-06-21 MED FILL — BUPROPION HCL ER (SR) 150 M: 150 | 30 days supply | Qty: 60 | Fill #5

## 2020-08-09 ENCOUNTER — Other Ambulatory Visit: Payer: Self-pay | Admitting: Family Medicine

## 2020-08-09 DIAGNOSIS — F410 Panic disorder [episodic paroxysmal anxiety] without agoraphobia: Secondary | ICD-10-CM

## 2020-08-10 ENCOUNTER — Other Ambulatory Visit: Payer: Self-pay | Admitting: Family Medicine

## 2020-08-10 MED ORDER — ALPRAZOLAM 0.25 MG PO TABS: 0.2500 mg | ORAL_TABLET | Freq: Two times a day (BID) | ORAL | 0 refills | Status: DC | PRN

## 2020-08-10 MED FILL — BUPROPION HCL ER (SR) 150 M: 150 | 30 days supply | Qty: 60 | Fill #6

## 2020-08-10 MED FILL — ALPRAZolam 0.25 MG TABS: 0.25 | 10 days supply | Qty: 20 | Fill #0

## 2020-08-10 NOTE — Telephone Encounter (Signed)
Requesting: alprazolam 0.25mg  Contract: None UDS: None Last Visit: 12/31/2019 Next Visit: None Last Refill: 04/08/2020 #20 and 0RF  Please Advise

## 2020-09-06 NOTE — Progress Notes (Signed)
[facility] [address], [address] [place], [place] [address] [phone]  Date:  09/08/2020   Name:  Alexis Dixon   DOB:  1996/06/05   MRN:  [id number]  PCP:  Pearline Cables, MD    Chief Complaint: Medication Management and Ankle Injury (Left ankle pain, some swelling, twisted ankle yesterday/)   History of Present Illness:  Alexis Dixon is a 25 y.o. very pleasant female patient who presents with the following:  Pt here today to discuss depression meds - history of allergies and asthma Last seen by myself in May for a CPE  Hep C screening Flu shot- give today  covid series done including booster   She is taking Wellbutrin, and as needed alprazolam for panic attacks We changed her from Prozac to Wellbutrin in February 2021.  At her last visit in May, patient noted depression was better as though anxiety was a bit worse.  However, she did not want to change medication at that time.  She recently got married, bought a house. 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  She thinks she has a sprain, she is having pain and some difficulty walking She has an IUD, no concern of current pregnancy  Patient Active Problem List   Diagnosis Date Noted  . Pre-diabetes 12/30/2019  . Seasonal and perennial allergic rhinitis 04/09/2019  . Chronic idiopathic urticaria 04/09/2019  . Gastroesophageal reflux disease 04/09/2019  . Mild intermittent asthma without complication  09/23/2017  . Dermographia 09/23/2017  . Wheezing 11/29/2016  . Other seasonal allergic rhinitis 01/24/2016  . Anaphylactic shock due to adverse food reaction 01/24/2016  . Seasonal allergic conjunctivitis 01/24/2016  . Headache(784.0) 07/05/2013    Past Medical History:  Diagnosis Date  . Allergy    Tree Nuts ; Claritin D  . Asthma    childhood; Albuterol and Singulair; no hospitalizations and ED visits.  . Eczema   . OMBTDHRC(163.8)     Past Surgical History:  Procedure Laterality Date  . WISDOM TOOTH EXTRACTION      Social History   Tobacco Use  . Smoking status: Never Smoker  . Smokeless tobacco: Never Used  Vaping Use  . Vaping Use: Never used  Substance Use Topics  . Alcohol use: Yes    Comment: socialy  . Drug use: No    Family History  Problem Relation Age of Onset  . Diabetes Mother   . Hyperlipidemia Maternal Grandmother   . Hypertension Maternal Grandmother   . Diabetes Maternal Grandmother   . Cancer Maternal Grandmother        breast  . Asthma Paternal Grandmother   . Asthma Sister   . Eczema Maternal Grandfather     Allergies  Allergen Reactions  . Other Swelling    Tree Nuts     Medication list has been reviewed and updated.  Current Outpatient Medications  on File Prior to Visit  Medication Sig Dispense Refill  . Adapalene 0.3 % gel Apply topically at bedtime 45 g 6  . albuterol (PROVENTIL HFA;VENTOLIN HFA) 108 (90 Base) MCG/ACT inhaler Inhale 2 puffs into the lungs every 6 (six) hours as needed for wheezing or shortness of breath. Reported on 08/16/2015 18 g 1  . ALPRAZolam (XANAX) 0.25 MG tablet Take 1 tablet (0.25 mg total) by mouth 2 (two) times daily as needed (panic attack). 20 tablet 0  . buPROPion (WELLBUTRIN SR) 150 MG 12 hr tablet Take 1 tablet (150 mg total) by mouth 2 (two) times daily. 60 tablet 6  . fluticasone (FLONASE) 50 MCG/ACT nasal spray 2 sprays per nostril once daily if needed for stuffy nose. 16 g 5  . fluticasone  (FLOVENT HFA) 110 MCG/ACT inhaler 2 puffs twice daily with spacer to prevent coughing or wheezing. 1 Inhaler 5  . levocetirizine (XYZAL) 5 MG tablet TAKE 1 TABLET BY MOUTH EVERY EVENING.*NEED OFFICE VISIT FOR ADDITIONAL REFILLS PER MD* 30 tablet 0  . montelukast (SINGULAIR) 10 MG tablet TAKE 1 TABLET BY MOUTH EVERY NIGHT AT BEDTIME 30 tablet 0  . NON FORMULARY Receives immunotherapy every week and at 0.50 every 2 weeks    . omeprazole (PRILOSEC) 20 MG capsule Take 1 capsule (20 mg total) by mouth daily. 30 capsule 0  . Spacer/Aero-Holding Rudean Curt Use as directed 1 Device 1  . SUMAtriptan (IMITREX) 100 MG tablet Take 1 tablet (100 mg total) by mouth every 2 (two) hours as needed for migraine. May repeat in 2 hours if headache persists or recurs. 8 tablet 5   No current facility-administered medications on file prior to visit.    Review of Systems:  As per HPI- otherwise negative.   Physical Examination: Vitals:   09/08/20 1302  BP: 128/82  Pulse: (!) 106  Resp: 18  SpO2: 98%   Vitals:   09/08/20 1302  Weight: 167 lb (75.8 kg)  Height: 5' 2.5" (1.588 m)   Body mass index is 30.06 kg/m. Ideal Body Weight: Weight in (lb) to have BMI = 25: 138.6  GEN: no acute distress. Overweight, looks well HEENT: Atraumatic, Normocephalic.  Ears and Nose: No external deformity. CV: RRR, No M/G/R. No JVD. No thrill. No extra heart sounds. PULM: CTA B, no wheezes, crackles, rhonchi. No retractions. No resp. distress. No accessory muscle use. EXTR: No c/c/e PSYCH: Normally interactive. Conversant. Left ankle: No swelling or bruising.  Patient has tenderness at the medial and lateral joint line.  Ankle feels stable, Achilles intact.  Knee exam is negative.  No pain over the distal fifth metatarsal  Pulse Readings from Last 3 Encounters:  09/08/20 (!) 106  12/31/19 92  10/07/19 75    Assessment and Plan: Sprain of left ankle, unspecified ligament, initial encounter - Plan: DG Ankle  Complete Left  GAD (generalized anxiety disorder) - Plan: venlafaxine XR (EFFEXOR XR) 75 MG 24 hr capsule  Patient here today with 2 concerns.  She sprained her ankle yesterday, had an inversion injury.  She has been able to walk on it but it does hurt.  We will obtain plain films today, assuming no fracture recommended ice, elevation, compression with an Ace wrap or an over-the-counter Swede-O type brace. If not improving in the next couple of weeks she is to let me know She feels she is able to perform her job duties with her current ankle injury  Discussed symptoms of anxiety and depression.  Patient is  currently taking Wellbutrin, she feels that her anxiety is getting worse.  She often wakes up at 2 in the morning and cannot go back to sleep, she starts worrying about things.  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  She notes that there has been "a lot of death" at work Denies any suicidal ideation, she states a good support system.  We will have her taper off Wellbutrin and starting Effexor XR. I have asked her to follow-up with me in 1 to 2 months for follow-up, sooner if not doing okay This visit occurred during the SARS-CoV-2 public health emergency.  Safety protocols were in place, including screening questions prior to the visit, additional usage of staff PPE, and extensive cleaning of exam room while observing appropriate contact time as indicated for disinfecting solutions.    Signed Abbe Amsterdam, MD

## 2020-09-08 ENCOUNTER — Other Ambulatory Visit: Payer: Self-pay | Admitting: Family Medicine

## 2020-09-08 ENCOUNTER — Ambulatory Visit: Payer: 59 | Admitting: Family Medicine

## 2020-09-08 ENCOUNTER — Encounter: Payer: Self-pay | Admitting: Family Medicine

## 2020-09-08 ENCOUNTER — Ambulatory Visit (HOSPITAL_BASED_OUTPATIENT_CLINIC_OR_DEPARTMENT_OTHER)
Admission: RE | Admit: 2020-09-08 | Discharge: 2020-09-08 | Disposition: A | Discharge status: Home / Self Care | Payer: 59 | Source: Ambulatory Visit | Attending: Family Medicine | Admitting: Family Medicine

## 2020-09-08 ENCOUNTER — Other Ambulatory Visit: Payer: Self-pay

## 2020-09-08 VITALS — BP 128/82 | HR 106 | Resp 18 | Ht 62.5 in | Wt 167.0 lb

## 2020-09-08 DIAGNOSIS — M25572 Pain in left ankle and joints of left foot: Secondary | ICD-10-CM | POA: Diagnosis not present

## 2020-09-08 DIAGNOSIS — S93402A Sprain of unspecified ligament of left ankle, initial encounter: Secondary | ICD-10-CM | POA: Diagnosis not present

## 2020-09-08 DIAGNOSIS — F411 Generalized anxiety disorder: Secondary | ICD-10-CM | POA: Insufficient documentation

## 2020-09-08 MED ORDER — VENLAFAXINE HCL ER 75 MG PO CP24: 75.0000 mg | ORAL_CAPSULE | Freq: Every day | ORAL | 6 refills | Status: DC

## 2020-09-08 MED FILL — VENLAFAXINE HCL ER 75 MG CA: 75 | 30 days supply | Qty: 30 | Fill #0

## 2020-09-08 NOTE — Addendum Note (Signed)
Addended byConrad Ramsey D on: 09/08/2020 02:26 PM   Modules accepted: Orders

## 2020-09-08 NOTE — Patient Instructions (Addendum)
It was good to see you again today!  I am sorry that you sprained your ankle- please go to the ground floor to have x-rays  Try elevation, ice, ibuprofen as needed.  You might get an OTC lace up ankle support as we discussed. If you don't see significant improvement in the next few weeks please let me know   Please try some doxlymaine for your insomnia-this is available OTC Let's taper off the wellbutrin and onto effexor Take wellbutin just once a day for 3 days and then stop- you can then start on effexor xr 75 mg  Counseling may be helpful as well  Please keep me posted as to how you are doing- let's plan for a virtual visit in 4-8 weeks unless you are much improved- sooner if needed

## 2020-10-03 DIAGNOSIS — Z20822 Contact with and (suspected) exposure to covid-19: Secondary | ICD-10-CM | POA: Diagnosis not present

## 2020-10-03 DIAGNOSIS — Z20828 Contact with and (suspected) exposure to other viral communicable diseases: Secondary | ICD-10-CM | POA: Diagnosis not present

## 2020-10-07 ENCOUNTER — Encounter: Payer: Self-pay | Admitting: Family Medicine

## 2020-10-11 MED FILL — VENLAFAXINE HCL ER 75 MG CA: 75 | 30 days supply | Qty: 30 | Fill #1

## 2020-10-20 ENCOUNTER — Other Ambulatory Visit: Payer: Self-pay | Admitting: Family Medicine

## 2020-10-20 DIAGNOSIS — F410 Panic disorder [episodic paroxysmal anxiety] without agoraphobia: Secondary | ICD-10-CM

## 2020-10-20 MED ORDER — ALPRAZOLAM 0.25 MG PO TABS: 0.2500 mg | ORAL_TABLET | Freq: Two times a day (BID) | ORAL | 1 refills | Status: DC | PRN

## 2020-10-20 MED FILL — ALPRAZolam 0.25 MG TABS: 0.25 | 10 days supply | Qty: 20 | Fill #0

## 2020-11-15 MED FILL — VENLAFAXINE HCL ER 75 MG CA: 75 | 30 days supply | Qty: 30 | Fill #2

## 2020-12-26 ENCOUNTER — Other Ambulatory Visit (HOSPITAL_BASED_OUTPATIENT_CLINIC_OR_DEPARTMENT_OTHER): Payer: Self-pay

## 2020-12-26 MED FILL — Venlafaxine HCl Cap ER 24HR 75 MG (Base Equivalent): ORAL | 30 days supply | Qty: 30 | Fill #0 | Status: AC

## 2021-01-31 ENCOUNTER — Encounter: Payer: Self-pay | Admitting: Family Medicine

## 2021-02-01 ENCOUNTER — Telehealth (INDEPENDENT_AMBULATORY_CARE_PROVIDER_SITE_OTHER): Payer: 59 | Admitting: Family Medicine

## 2021-02-01 ENCOUNTER — Other Ambulatory Visit (HOSPITAL_BASED_OUTPATIENT_CLINIC_OR_DEPARTMENT_OTHER): Payer: Self-pay

## 2021-02-01 ENCOUNTER — Other Ambulatory Visit: Payer: Self-pay

## 2021-02-01 DIAGNOSIS — F411 Generalized anxiety disorder: Secondary | ICD-10-CM | POA: Diagnosis not present

## 2021-02-01 MED ORDER — FLUOXETINE HCL 20 MG PO CAPS
20.0000 mg | ORAL_CAPSULE | Freq: Every day | ORAL | 3 refills | Status: DC
  Filled 2021-02-01: qty 60, 30d supply, fill #0
  Filled 2021-03-16: qty 60, 30d supply, fill #1
  Filled 2021-05-02: qty 60, 30d supply, fill #2
  Filled 2021-06-21: qty 60, 30d supply, fill #3

## 2021-02-01 NOTE — Progress Notes (Signed)
[facility] [address], [address] [place], [place] [address] [phone]  Date:  02/01/2021   Name:  Alexis Dixon   DOB:  10/01/1995   MRN:  [id number]  PCP:  Pearline Cables, MD    Chief Complaint: No chief complaint on file.   History of Present Illness:  Alexis Dixon is a 25 y.o. very pleasant female patient who presents with the following:  Virtual visit today to discuss medication for anxiety Most recent visit with myself in January- history of allergies and asthma  She has used prozac in the past, and then wellbutrin, changed to effexor xr in January  She is at home right now, my location is office Patient identity confirmed with 2 factors, she gives consent for virtual visit today.  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   She notes sx of both anxiety and depression No SI  She is not currently pregnant or planning to become pregnant She has used some CBD to help with sleep - this does help her some  She is exercising by walking her 2 husky dogs   Patient Active Problem List   Diagnosis Date Noted  . Pre-diabetes 12/30/2019  . Seasonal and perennial allergic rhinitis 04/09/2019  . Chronic idiopathic urticaria 04/09/2019  . Gastroesophageal reflux disease 04/09/2019  . Mild intermittent asthma without complication 09/23/2017  . Dermographia 09/23/2017  . Wheezing 11/29/2016  . Other seasonal allergic rhinitis 01/24/2016  . Anaphylactic shock due to adverse food reaction 01/24/2016  . Seasonal allergic conjunctivitis 01/24/2016  . Headache(784.0) 07/05/2013    Past Medical History:  Diagnosis Date  . Allergy    Tree Nuts ; Claritin D  . Asthma    childhood; Albuterol and Singulair; no  hospitalizations and ED visits.  . Eczema   . ONGEXBMW(413.2)     Past Surgical History:  Procedure Laterality Date  . WISDOM TOOTH EXTRACTION      Social History   Tobacco Use  . Smoking status: Never Smoker  . Smokeless tobacco: Never Used  Vaping Use  . Vaping Use: Never used  Substance Use Topics  . Alcohol use: Yes    Comment: socialy  . Drug use: No    Family History  Problem Relation Age of Onset  . Diabetes Mother   . Hyperlipidemia Maternal Grandmother   . Hypertension Maternal Grandmother   . Diabetes Maternal Grandmother   . Cancer Maternal Grandmother        breast  . Asthma Paternal Grandmother   . Asthma Sister   . Eczema Maternal Grandfather     Allergies  Allergen Reactions  . Other Swelling    Tree Nuts     Medication list has been reviewed and updated.  Current Outpatient Medications on File Prior to Visit  Medication Sig Dispense Refill  . Adapalene 0.3 % gel Apply topically at bedtime 45 g 6  . albuterol (PROVENTIL HFA;VENTOLIN HFA) 108 (90 Base) MCG/ACT inhaler Inhale 2 puffs into the lungs every 6 (six) hours as needed for wheezing or shortness of breath. Reported on 08/16/2015 18 g 1  . ALPRAZolam (XANAX) 0.25 MG tablet TAKE 1 TABLET BY MOUTH 2 TIMES DAILY AS NEEDED  FOR PANIC ATTACK 20 tablet 1  . buPROPion (WELLBUTRIN SR) 150 MG 12 hr tablet Take 1 tablet (150 mg total) by mouth 2 (two) times daily. 60 tablet 6  . fluticasone (FLONASE) 50 MCG/ACT nasal spray 2 sprays per nostril once daily if needed for stuffy nose. 16 g 5  . fluticasone (FLOVENT HFA) 110 MCG/ACT inhaler 2 puffs twice daily with spacer to prevent coughing or wheezing. 1 Inhaler 5  . levocetirizine (XYZAL) 5 MG tablet TAKE 1 TABLET BY MOUTH EVERY EVENING.*NEED OFFICE VISIT FOR ADDITIONAL REFILLS PER MD* 30 tablet 0  . montelukast (SINGULAIR) 10 MG tablet TAKE 1 TABLET BY MOUTH EVERY NIGHT AT BEDTIME 30 tablet 0  . NON FORMULARY Receives immunotherapy every week and at 0.50  every 2 weeks    . omeprazole (PRILOSEC) 20 MG capsule Take 1 capsule (20 mg total) by mouth daily. 30 capsule 0  . Spacer/Aero-Holding Rudean Curt Use as directed 1 Device 1  . SUMAtriptan (IMITREX) 100 MG tablet Take 1 tablet (100 mg total) by mouth every 2 (two) hours as needed for migraine. May repeat in 2 hours if headache persists or recurs. 8 tablet 5  . venlafaxine XR (EFFEXOR-XR) 75 MG 24 hr capsule TAKE 1 CAPSULE (75 MG TOTAL) BY MOUTH DAILY WITH BREAKFAST. 30 capsule 6   No current facility-administered medications on file prior to visit.    Review of Systems:  As per HPI- otherwise negative.   Physical Examination: There were no vitals filed for this visit. There were no vitals filed for this visit. There is no height or weight on file to calculate BMI. Ideal Body Weight:    Patient is urged via video monitor.  She looks well, no shortness of breath or distress is noted  Assessment and Plan: GAD (generalized anxiety disorder) - Plan: FLUoxetine (PROZAC) 20 MG capsule  Virtual visit today to discuss anxiety.  Patient has used in the past Prozac, Wellbutrin and Effexor.  Currently using Effexor but notes some bothersome side effects as well as inefficacy.  She would like to go back to Prozac which she feels works best for her.  We discussed plans to have her taper off of Effexor-take every other day for 1 week-then start on Prozac 20 mg.  Advised that she can increase to 40 mg after about 2 weeks if she likes.  She agrees to update me via MyChart in a few weeks and let me know how she is doing-sooner if not doing okay  Sleep is also a problem.  She gets to sleep fine but has difficulty staying asleep, so alprazolam less likely to be helpful. We might consider adding trazodone to her regimen in a month or so if needed.  She will keep me posted  Video used for duration of visit today  Signed Abbe Amsterdam, MD

## 2021-02-12 DIAGNOSIS — Z20822 Contact with and (suspected) exposure to covid-19: Secondary | ICD-10-CM | POA: Diagnosis not present

## 2021-02-12 DIAGNOSIS — Z03818 Encounter for observation for suspected exposure to other biological agents ruled out: Secondary | ICD-10-CM | POA: Diagnosis not present

## 2021-03-11 DIAGNOSIS — Z20822 Contact with and (suspected) exposure to covid-19: Secondary | ICD-10-CM | POA: Diagnosis not present

## 2021-03-11 DIAGNOSIS — Z20828 Contact with and (suspected) exposure to other viral communicable diseases: Secondary | ICD-10-CM | POA: Diagnosis not present

## 2021-03-16 ENCOUNTER — Other Ambulatory Visit (HOSPITAL_BASED_OUTPATIENT_CLINIC_OR_DEPARTMENT_OTHER): Payer: Self-pay

## 2021-03-16 DIAGNOSIS — Z20828 Contact with and (suspected) exposure to other viral communicable diseases: Secondary | ICD-10-CM | POA: Diagnosis not present

## 2021-03-16 DIAGNOSIS — Z20822 Contact with and (suspected) exposure to covid-19: Secondary | ICD-10-CM | POA: Diagnosis not present

## 2021-03-16 MED FILL — Alprazolam Tab 0.25 MG: ORAL | 10 days supply | Qty: 20 | Fill #0 | Status: AC

## 2021-03-23 ENCOUNTER — Encounter: Payer: Self-pay | Admitting: Family Medicine

## 2021-03-24 ENCOUNTER — Other Ambulatory Visit: Payer: Self-pay

## 2021-03-24 ENCOUNTER — Ambulatory Visit (HOSPITAL_BASED_OUTPATIENT_CLINIC_OR_DEPARTMENT_OTHER)
Admission: RE | Admit: 2021-03-24 | Discharge: 2021-03-24 | Disposition: A | Discharge status: Home / Self Care | Payer: 59 | Source: Ambulatory Visit | Attending: Family Medicine | Admitting: Family Medicine

## 2021-03-24 ENCOUNTER — Encounter: Payer: Self-pay | Admitting: Family Medicine

## 2021-03-24 ENCOUNTER — Ambulatory Visit: Payer: 59 | Admitting: Family Medicine

## 2021-03-24 ENCOUNTER — Other Ambulatory Visit (HOSPITAL_BASED_OUTPATIENT_CLINIC_OR_DEPARTMENT_OTHER): Payer: Self-pay

## 2021-03-24 VITALS — BP 124/84 | HR 94 | Temp 98.2°F | Ht 63.0 in | Wt 174.8 lb

## 2021-03-24 DIAGNOSIS — R6889 Other general symptoms and signs: Secondary | ICD-10-CM | POA: Diagnosis not present

## 2021-03-24 DIAGNOSIS — R059 Cough, unspecified: Secondary | ICD-10-CM | POA: Insufficient documentation

## 2021-03-24 DIAGNOSIS — R112 Nausea with vomiting, unspecified: Secondary | ICD-10-CM | POA: Diagnosis not present

## 2021-03-24 DIAGNOSIS — M549 Dorsalgia, unspecified: Secondary | ICD-10-CM

## 2021-03-24 LAB — CBC
HCT: 39.5 % (ref 36.0–46.0)
Hemoglobin: 13.1 g/dL (ref 12.0–15.0)
MCHC: 33.2 g/dL (ref 30.0–36.0)
MCV: 90 fl (ref 78.0–100.0)
Platelets: 287 10*3/uL (ref 150.0–400.0)
RBC: 4.39 Mil/uL (ref 3.87–5.11)
RDW: 13.5 % (ref 11.5–15.5)
WBC: 4.1 10*3/uL (ref 4.0–10.5)

## 2021-03-24 LAB — COMPREHENSIVE METABOLIC PANEL
ALT: 24 U/L (ref 0–35)
AST: 19 U/L (ref 0–37)
Albumin: 4.8 g/dL (ref 3.5–5.2)
Alkaline Phosphatase: 114 U/L (ref 39–117)
BUN: 9 mg/dL (ref 6–23)
CO2: 28 mEq/L (ref 19–32)
Calcium: 9.9 mg/dL (ref 8.4–10.5)
Chloride: 103 mEq/L (ref 96–112)
Creatinine, Ser: 0.91 mg/dL (ref 0.40–1.20)
GFR: 89.01 mL/min (ref >60.00)
Glucose, Bld: 90 mg/dL (ref 70–99)
Potassium: 4.4 mEq/L (ref 3.5–5.1)
Sodium: 139 mEq/L (ref 135–145)
Total Bilirubin: 0.4 mg/dL (ref 0.2–1.2)
Total Protein: 7.6 g/dL (ref 6.0–8.3)

## 2021-03-24 LAB — POCT URINALYSIS DIP (MANUAL ENTRY)
Blood, UA: NEGATIVE
Glucose, UA: NEGATIVE mg/dL
Ketones, POC UA: NEGATIVE mg/dL
Nitrite, UA: NEGATIVE
Protein Ur, POC: 30 mg/dL — AB
Spec Grav, UA: 1.015 (ref 1.010–1.025)
Urobilinogen, UA: 1 E.U./dL
pH, UA: 7 (ref 5.0–8.0)

## 2021-03-24 LAB — POCT URINE PREGNANCY: Preg Test, Ur: NEGATIVE

## 2021-03-24 LAB — POCT RAPID STREP A (OFFICE): Rapid Strep A Screen: NEGATIVE

## 2021-03-24 LAB — POCT INFLUENZA A/B
Influenza A, POC: NEGATIVE
Influenza B, POC: NEGATIVE

## 2021-03-24 MED ORDER — HYDROCODONE BIT-HOMATROP MBR 5-1.5 MG/5ML PO SOLN
5.0000 mL | Freq: Three times a day (TID) | ORAL | 0 refills | Status: AC | PRN
Start: 2021-03-24 — End: 2021-03-29
  Filled 2021-03-24: qty 60, 4d supply, fill #0

## 2021-03-24 MED ORDER — ONDANSETRON 4 MG PO TBDP
4.0000 mg | ORAL_TABLET | Freq: Three times a day (TID) | ORAL | 0 refills | Status: DC | PRN
Start: 2021-03-24 — End: 2021-05-02
  Filled 2021-03-24: qty 30, 10d supply, fill #0

## 2021-03-24 NOTE — Patient Instructions (Signed)
It was good to see you again today- I am sorry you are sick!  We will be in touch with your chest x-ray and labs Use the zofran as needed for nausea and vomiting, and the hycodan syrup as needed for cough/ not able to sleep If you are getting worse contact me right away!

## 2021-03-24 NOTE — Progress Notes (Addendum)
[facility] [address] [place], [place] [address] [phone]  Date:  03/24/2021   Name:  Alexis Dixon   DOB:  04-05-1996   MRN:  [id number]  PCP:  Pearline Cables, MD    Chief Complaint: Nausea, Headache (d), and Diarrhea (All going on for the last couple of weeks. W/ neg covid test )   History of Present Illness:  Alexis Dixon is a 25 y.o. very pleasant female patient who presents with the following:  Patient with history of allergies and asthma, here today with concern of illness.  She had sent me a message yesterday  Tested for COVID twice, negative She first noted sx of illness 2 weeks ago with ST and mild runny nose She developed a cough Vomiting and diarrhea started 2 days ago She notes body aches, and chills.  She has not noted a fever but is taking Tylenol on a consistent basis No rashes  She feels like she has gotten worse over the the 2 weeks She has lost 10 lbs- she is not able to eat as much as normal and is also not drinking liquids that well Mild diarrhea She was vomiting yesterday and intermittently over the last 2 weeks; she vomited perhaps 3x yesterday  She is having some abdominal discomfort which is difficult to localize  She tested for covid at home yesterday- negative Tested at clinic last week- negative for covid  Pt notes that her weight is down about 10 lbs from her most recent baseline   Wt Readings from Last 3 Encounters:  03/24/21 174 lb 12.8 oz (79.3 kg)  09/08/20 167 lb (75.8 kg)  12/31/19 172 lb (78 kg)     Patient Active Problem List   Diagnosis Date Noted   Pre-diabetes 12/30/2019   Seasonal and perennial allergic rhinitis 04/09/2019   Chronic idiopathic urticaria 04/09/2019   Gastroesophageal reflux disease 04/09/2019   Mild intermittent asthma without complication 09/23/2017   Dermographia 09/23/2017   Wheezing 11/29/2016   Other seasonal allergic rhinitis  01/24/2016   Anaphylactic shock due to adverse food reaction 01/24/2016   Seasonal allergic conjunctivitis 01/24/2016   Headache(784.0) 07/05/2013    Past Medical History:  Diagnosis Date   Allergy    Tree Nuts ; Claritin D   Asthma    childhood; Albuterol and Singulair; no hospitalizations and ED visits.   Eczema    Headache(784.0)     Past Surgical History:  Procedure Laterality Date   WISDOM TOOTH EXTRACTION      Social History   Tobacco Use   Smoking status: Never   Smokeless tobacco: Never  Vaping Use   Vaping Use: Never used  Substance Use Topics   Alcohol use: Yes    Comment: socialy   Drug use: No    Family History  Problem Relation Age of Onset   Diabetes Mother    Hyperlipidemia Maternal Grandmother    Hypertension Maternal Grandmother    Diabetes Maternal Grandmother    Cancer Maternal Grandmother        breast   Asthma Paternal Grandmother    Asthma Sister    Eczema Maternal Grandfather     Allergies  Allergen Reactions   Other Swelling    Tree Nuts     Medication list has been reviewed and updated.  Current Outpatient Medications on File Prior to Visit  Medication Sig Dispense Refill   Adapalene 0.3 % gel  Apply topically at bedtime 45 g 6   albuterol (PROVENTIL HFA;VENTOLIN HFA) 108 (90 Base) MCG/ACT inhaler Inhale 2 puffs into the lungs every 6 (six) hours as needed for wheezing or shortness of breath. Reported on 08/16/2015 18 g 1   ALPRAZolam (XANAX) 0.25 MG tablet TAKE 1 TABLET BY MOUTH 2 TIMES DAILY AS NEEDED FOR PANIC ATTACK 20 tablet 1   FLUoxetine (PROZAC) 20 MG capsule Take 1 capsule (20 mg total) by mouth daily. Increase to 40 mg after 2 weeks if desired 60 capsule 3   fluticasone (FLONASE) 50 MCG/ACT nasal spray 2 sprays per nostril once daily if needed for stuffy nose. 16 g 5   fluticasone (FLOVENT HFA) 110 MCG/ACT inhaler 2 puffs twice daily with spacer to prevent coughing or wheezing. 1 Inhaler 5   levocetirizine (XYZAL) 5 MG  tablet TAKE 1 TABLET BY MOUTH EVERY EVENING.*NEED OFFICE VISIT FOR ADDITIONAL REFILLS PER MD* 30 tablet 0   omeprazole (PRILOSEC) 20 MG capsule Take 1 capsule (20 mg total) by mouth daily. 30 capsule 0   Spacer/Aero-Holding Chambers DEVI Use as directed 1 Device 1   SUMAtriptan (IMITREX) 100 MG tablet Take 1 tablet (100 mg total) by mouth every 2 (two) hours as needed for migraine. May repeat in 2 hours if headache persists or recurs. 8 tablet 5   No current facility-administered medications on file prior to visit.    Review of Systems:  As per HPI- otherwise negative.   Physical Examination: Vitals:   03/24/21 1151  BP: 124/84  Pulse: 94  Temp: 98.2 F (36.8 C)  SpO2: 99%   Vitals:   03/24/21 1151  Weight: 174 lb 12.8 oz (79.3 kg)  Height: 5\' 3"  (1.6 m)   Body mass index is 30.96 kg/m. Ideal Body Weight: Weight in (lb) to have BMI = 25: 140.8  GEN: no acute distress.  Obese, generally appears well HEENT: Atraumatic, Normocephalic.  Bilateral TM wnl, oropharynx normal.  PEERL,EOMI.   Ears and Nose: No external deformity. CV: RRR, No M/G/R. No JVD. No thrill. No extra heart sounds. PULM: CTA B, no wheezes, crackles, rhonchi. No retractions. No resp. distress. No accessory muscle use. ABD: S,  ND, +BS. No rebound. No HSM. Patient endorses vague discomfort over her abdomen, I am not able to localize tenderness despite repeated exams EXTR: No c/c/e PSYCH: Normally interactive. Conversant.  There is a small bruise over the right knee  Results for orders placed or performed in visit on 03/24/21  POCT Influenza A/B  Result Value Ref Range   Influenza A, POC Negative Negative   Influenza B, POC Negative Negative  POCT urine pregnancy  Result Value Ref Range   Preg Test, Ur Negative Negative  POCT urinalysis dipstick  Result Value Ref Range   Color, UA yellow yellow   Clarity, UA clear clear   Glucose, UA negative negative mg/dL   Bilirubin, UA small (A) negative    Ketones, POC UA negative negative mg/dL   Spec Grav, UA 03/26/21 4.010 - 1.025   Blood, UA negative negative   pH, UA 7.0 5.0 - 8.0   Protein Ur, POC =30 (A) negative mg/dL   Urobilinogen, UA 1.0 0.2 or 1.0 E.U./dL   Nitrite, UA Negative Negative   Leukocytes, UA Trace (A) Negative  POCT rapid strep A  Result Value Ref Range   Rapid Strep A Screen Negative Negative     Assessment and Plan: Flu-like symptoms - Plan: POCT Influenza A/B, POCT urine pregnancy, CBC,  Comprehensive metabolic panel, DG Chest 2 View, POCT rapid strep A  Acute bilateral back pain, unspecified back location - Plan: POCT urinalysis dipstick, Urine Culture  Non-intractable vomiting with nausea, unspecified vomiting type - Plan: ondansetron (ZOFRAN ODT) 4 MG disintegrating tablet  Cough - Plan: HYDROcodone bit-homatropine (HYCODAN) 5-1.5 MG/5ML syrup Patient seen today with about 2 weeks of flulike symptoms.  She has been tested for COVID-19 twice, including yesterday and negative.  Today rapid flu and rapid strep are negative.  Urine dip is consistent with dehydration.  Patient admits she has not been eating or drinking much due to feeling poorly.  We discussed options-can obtain lab work and chest x-ray and respond as indicated, or could have her seen in the emergency department for IV fluids and other evaluation  We also discussed potentially doing a CT scan of her abdomen pelvis.  Her exam at this time is relatively benign, but if she shows significant leukocytosis will consider doing a CT  For the time being she elects to have lab work and chest x-ray.  I have ordered the stat and will get in touch with her with results  Prescribed Hycodan to use as needed for cough and inability to sleep, Zofran to use for nausea  She agrees to seek care if getting worse or if symptoms are changing  Signed Abbe AmsterdamJessica Brianny Soulliere, MD   Received her labs and chest film, gave her a call Discussed results, so far all normal-I do not  have an explanation for symptoms  We decided to have her use Zofran and attempt to hydrate and hopefully eat.  However, if she is not doing better tomorrow she needs to go to the emergency room for further evaluation and possible IV hydration.  If she is getting worse she should go sooner.  She states understanding and agreement DG Chest 2 View  Result Date: 03/24/2021 CLINICAL DATA:  Cough. EXAM: CHEST - 2 VIEW COMPARISON:  None. FINDINGS: The heart size and mediastinal contours are within normal limits. Both lungs are clear. The visualized skeletal structures are unremarkable. IMPRESSION: No active cardiopulmonary disease. Electronically Signed   By: Lupita RaiderJames  Green Jr M.D.   On: 03/24/2021 13:12    Results for orders placed or performed in visit on 03/24/21  CBC  Result Value Ref Range   WBC 4.1 4.0 - 10.5 K/uL   RBC 4.39 3.87 - 5.11 Mil/uL   Platelets 287.0 150.0 - 400.0 K/uL   Hemoglobin 13.1 12.0 - 15.0 g/dL   HCT 40.939.5 81.136.0 - 91.446.0 %   MCV 90.0 78.0 - 100.0 fl   MCHC 33.2 30.0 - 36.0 g/dL   RDW 78.213.5 95.611.5 - 21.315.5 %  Comprehensive metabolic panel  Result Value Ref Range   Sodium 139 135 - 145 mEq/L   Potassium 4.4 3.5 - 5.1 mEq/L   Chloride 103 96 - 112 mEq/L   CO2 28 19 - 32 mEq/L   Glucose, Bld 90 70 - 99 mg/dL   BUN 9 6 - 23 mg/dL   Creatinine, Ser 0.860.91 0.40 - 1.20 mg/dL   Total Bilirubin 0.4 0.2 - 1.2 mg/dL   Alkaline Phosphatase 114 39 - 117 U/L   AST 19 0 - 37 U/L   ALT 24 0 - 35 U/L   Total Protein 7.6 6.0 - 8.3 g/dL   Albumin 4.8 3.5 - 5.2 g/dL   GFR 57.8489.01 >69.62>60.00 mL/min   Calcium 9.9 8.4 - 10.5 mg/dL  POCT Influenza A/B  Result Value Ref  Range   Influenza A, POC Negative Negative   Influenza B, POC Negative Negative  POCT urine pregnancy  Result Value Ref Range   Preg Test, Ur Negative Negative  POCT urinalysis dipstick  Result Value Ref Range   Color, UA yellow yellow   Clarity, UA clear clear   Glucose, UA negative negative mg/dL   Bilirubin, UA small (A)  negative   Ketones, POC UA negative negative mg/dL   Spec Grav, UA 3.235 5.732 - 1.025   Blood, UA negative negative   pH, UA 7.0 5.0 - 8.0   Protein Ur, POC =30 (A) negative mg/dL   Urobilinogen, UA 1.0 0.2 or 1.0 E.U./dL   Nitrite, UA Negative Negative   Leukocytes, UA Trace (A) Negative  POCT rapid strep A  Result Value Ref Range   Rapid Strep A Screen Negative Negative

## 2021-03-25 ENCOUNTER — Emergency Department (HOSPITAL_COMMUNITY)
Admission: EM | Admit: 2021-03-25 | Discharge: 2021-03-25 | Disposition: A | Discharge status: Home / Self Care | Payer: 59 | Attending: Emergency Medicine | Admitting: Emergency Medicine

## 2021-03-25 ENCOUNTER — Other Ambulatory Visit: Payer: Self-pay

## 2021-03-25 DIAGNOSIS — J4520 Mild intermittent asthma, uncomplicated: Secondary | ICD-10-CM | POA: Diagnosis not present

## 2021-03-25 DIAGNOSIS — R799 Abnormal finding of blood chemistry, unspecified: Secondary | ICD-10-CM | POA: Insufficient documentation

## 2021-03-25 DIAGNOSIS — R899 Unspecified abnormal finding in specimens from other organs, systems and tissues: Secondary | ICD-10-CM

## 2021-03-25 DIAGNOSIS — R5383 Other fatigue: Secondary | ICD-10-CM | POA: Insufficient documentation

## 2021-03-25 LAB — URINE CULTURE
MICRO NUMBER:: 12179666
SPECIMEN QUALITY:: ADEQUATE

## 2021-03-25 NOTE — Discharge Instructions (Addendum)
Your white blood cell count is within our normal range.  Given the length of time you have been having symptoms, I do not think you have an emergent condition and believe you to be safe for discharge with continued outpatient workup.

## 2021-03-25 NOTE — ED Triage Notes (Signed)
Pt c/o feeling lethargic and decrease appetite x2 weeks. Nausea and vomiting x3 days with decrease PO intake. Followed up with PCP. Labs showed WBC 4.1. Denies urinary problems/flu-like symptoms. Denies medical HX

## 2021-03-25 NOTE — ED Provider Notes (Signed)
[facility] EMERGENCY DEPARTMENT Provider Note   CSN: [id number] Arrival date & time: 03/25/21  0046     History Chief Complaint  Patient presents with   Abnormal Lab    WBC 4.1    Alexis Dixon is a 25 y.o. female.  Patient presents to the emergency department with a chief complaint of generalized fatigue x2 weeks, nausea and vomiting for the past 3 days, and decreased oral intake.  She was seen recently by her PCP and had labs.  She states that she was concerned because her white blood cell count was 4.1.  She was concerned that this was low, and needed to be evaluated.  She denies any urinary symptoms.  Denies any medical problems.  She states that she would like to go home rather than pursue further work-up tonight.  The history is provided by the patient. No language interpreter was used.      Past Medical History:  Diagnosis Date   Allergy    Tree Nuts ; Claritin D   Asthma    childhood; Albuterol and Singulair; no hospitalizations and ED visits.   Eczema    Headache(784.0)     Patient Active Problem List   Diagnosis Date Noted   Pre-diabetes 12/30/2019   Seasonal and perennial allergic rhinitis 04/09/2019   Chronic idiopathic urticaria 04/09/2019   Gastroesophageal reflux disease 04/09/2019   Mild intermittent asthma without complication 09/23/2017   Dermographia 09/23/2017   Wheezing 11/29/2016   Other seasonal allergic rhinitis 01/24/2016   Anaphylactic shock due to adverse food reaction 01/24/2016   Seasonal allergic conjunctivitis 01/24/2016   Headache(784.0) 07/05/2013    Past Surgical History:  Procedure Laterality Date   WISDOM TOOTH EXTRACTION       OB History     Gravida  0   Para  0   Term  0   Preterm  0   AB  0   Living  0      SAB  0   IAB  0   Ectopic  0   Multiple  0   Live Births  0           Family History  Problem Relation Age of Onset   Diabetes Mother    Hyperlipidemia Maternal  Grandmother    Hypertension Maternal Grandmother    Diabetes Maternal Grandmother    Cancer Maternal Grandmother        breast   Asthma Paternal Grandmother    Asthma Sister    Eczema Maternal Grandfather     Social History   Tobacco Use   Smoking status: Never   Smokeless tobacco: Never  Vaping Use   Vaping Use: Never used  Substance Use Topics   Alcohol use: Yes    Comment: socialy   Drug use: No    Home Medications Prior to Admission medications   Medication Sig Start Date End Date Taking? Authorizing Provider  Adapalene 0.3 % gel Apply topically at bedtime 02/03/18   Copland, Gwenlyn Found, MD  albuterol (PROVENTIL HFA;VENTOLIN HFA) 108 (90 Base) MCG/ACT inhaler Inhale 2 puffs into the lungs every 6 (six) hours as needed for wheezing or shortness of breath. Reported on 08/16/2015 05/02/16   Ethelda Chick, MD  ALPRAZolam Prudy Feeler) 0.25 MG tablet TAKE 1 TABLET BY MOUTH 2 TIMES DAILY AS NEEDED FOR PANIC ATTACK 10/20/20 04/18/21  Copland, Gwenlyn Found, MD  FLUoxetine (PROZAC) 20 MG capsule Take 1 capsule (20 mg total) by mouth daily. Increase  to 40 mg after 2 weeks if desired 02/01/21   Copland, Gwenlyn Found, MD  fluticasone (FLONASE) 50 MCG/ACT nasal spray 2 sprays per nostril once daily if needed for stuffy nose. 04/09/19   Hetty Blend, FNP  fluticasone (FLOVENT HFA) 110 MCG/ACT inhaler 2 puffs twice daily with spacer to prevent coughing or wheezing. 04/09/19   Ambs, Norvel Richards, FNP  HYDROcodone bit-homatropine (HYCODAN) 5-1.5 MG/5ML syrup Take 5 mLs by mouth every 8 (eight) hours as needed for up to 5 days for cough. 03/24/21 03/29/21  Copland, Gwenlyn Found, MD  levocetirizine (XYZAL) 5 MG tablet TAKE 1 TABLET BY MOUTH EVERY EVENING.*NEED OFFICE VISIT FOR ADDITIONAL REFILLS PER MD* 01/17/18   Bobbitt, Heywood Iles, MD  omeprazole (PRILOSEC) 20 MG capsule Take 1 capsule (20 mg total) by mouth daily. 12/04/18   Lurene Shadow, PA-C  ondansetron (ZOFRAN ODT) 4 MG disintegrating tablet Take 1 tablet (4 mg total)  by mouth every 8 (eight) hours as needed for nausea or vomiting. 03/24/21   Copland, Gwenlyn Found, MD  Spacer/Aero-Holding Rudean Curt Use as directed 04/09/19   Ambs, Norvel Richards, FNP  SUMAtriptan (IMITREX) 100 MG tablet Take 1 tablet (100 mg total) by mouth every 2 (two) hours as needed for migraine. May repeat in 2 hours if headache persists or recurs. 09/30/17   Copland, Gwenlyn Found, MD    Allergies    Other  Review of Systems   Review of Systems  All other systems reviewed and are negative.  Physical Exam Updated Vital Signs BP 113/84   Pulse 89   Temp 98.1 F (36.7 C) (Oral)   Resp 19   SpO2 98%   Physical Exam Vitals and nursing note reviewed.  Constitutional:      General: She is not in acute distress.    Appearance: She is well-developed.  HENT:     Head: Normocephalic and atraumatic.  Eyes:     Conjunctiva/sclera: Conjunctivae normal.  Cardiovascular:     Rate and Rhythm: Normal rate and regular rhythm.     Heart sounds: No murmur heard. Pulmonary:     Effort: Pulmonary effort is normal. No respiratory distress.     Breath sounds: Normal breath sounds.  Abdominal:     Palpations: Abdomen is soft.     Tenderness: There is no abdominal tenderness.  Musculoskeletal:     Cervical back: Neck supple.  Skin:    General: Skin is warm and dry.  Neurological:     Mental Status: She is alert.    ED Results / Procedures / Treatments   Labs (all labs ordered are listed, but only abnormal results are displayed) Labs Reviewed - No data to display  EKG None  Radiology DG Chest 2 View  Result Date: 03/24/2021 CLINICAL DATA:  Cough. EXAM: CHEST - 2 VIEW COMPARISON:  None. FINDINGS: The heart size and mediastinal contours are within normal limits. Both lungs are clear. The visualized skeletal structures are unremarkable. IMPRESSION: No active cardiopulmonary disease. Electronically Signed   By: Lupita Raider M.D.   On: 03/24/2021 13:12    Procedures Procedures    Medications Ordered in ED Medications - No data to display  ED Course  I have reviewed the triage vital signs and the nursing notes.  Pertinent labs & imaging results that were available during my care of the patient were reviewed by me and considered in my medical decision making (see chart for details).    MDM Rules/Calculators/A&P  Patient is a very well-appearing 26 year old female with normal vital signs, who comes in due to a lab test of white blood cell count of 4.1.  She was concerned that this was low.  I reassured her that this falls within our normal range, though it is on the lower end of normal.  Given her reassuring appearance, normal vitals, and essentially being asymptomatic tonight, do not feel that any further emergent work-up is indicated.  I did offer her to repeat blood work, but she would rather go home and follow-up with her doctor.  I think this is perfectly reasonable. Final Clinical Impression(s) / ED Diagnoses Final diagnoses:  Abnormal laboratory test    Rx / DC Orders ED Discharge Orders     None        Roxy Horseman, PA-C 03/25/21 0125    Palumbo, April, MD 03/25/21 [phone]

## 2021-03-26 MED ORDER — AMOXICILLIN-POT CLAVULANATE 875-125 MG PO TABS
1.0000 | ORAL_TABLET | Freq: Two times a day (BID) | ORAL | 0 refills | Status: DC
  Filled 2021-03-26: qty 10, 5d supply, fill #0

## 2021-03-27 ENCOUNTER — Other Ambulatory Visit (HOSPITAL_BASED_OUTPATIENT_CLINIC_OR_DEPARTMENT_OTHER): Payer: Self-pay

## 2021-03-29 ENCOUNTER — Encounter: Payer: Self-pay | Admitting: Family Medicine

## 2021-03-29 DIAGNOSIS — F29 Unspecified psychosis not due to a substance or known physiological condition: Secondary | ICD-10-CM | POA: Diagnosis not present

## 2021-03-29 DIAGNOSIS — Z79899 Other long term (current) drug therapy: Secondary | ICD-10-CM | POA: Diagnosis not present

## 2021-03-29 DIAGNOSIS — Z20822 Contact with and (suspected) exposure to covid-19: Secondary | ICD-10-CM | POA: Diagnosis not present

## 2021-03-30 DIAGNOSIS — Z638 Other specified problems related to primary support group: Secondary | ICD-10-CM | POA: Diagnosis not present

## 2021-03-30 DIAGNOSIS — Z818 Family history of other mental and behavioral disorders: Secondary | ICD-10-CM | POA: Diagnosis not present

## 2021-03-30 DIAGNOSIS — Z9151 Personal history of suicidal behavior: Secondary | ICD-10-CM | POA: Diagnosis not present

## 2021-03-30 DIAGNOSIS — F12951 Cannabis use, unspecified with psychotic disorder with hallucinations: Secondary | ICD-10-CM | POA: Diagnosis not present

## 2021-03-30 DIAGNOSIS — F411 Generalized anxiety disorder: Secondary | ICD-10-CM | POA: Diagnosis not present

## 2021-03-30 DIAGNOSIS — Z596 Low income: Secondary | ICD-10-CM | POA: Diagnosis not present

## 2021-03-30 DIAGNOSIS — F319 Bipolar disorder, unspecified: Secondary | ICD-10-CM | POA: Diagnosis not present

## 2021-03-30 DIAGNOSIS — F12950 Cannabis use, unspecified with psychotic disorder with delusions: Secondary | ICD-10-CM | POA: Diagnosis not present

## 2021-03-30 DIAGNOSIS — F2081 Schizophreniform disorder: Secondary | ICD-10-CM | POA: Diagnosis not present

## 2021-03-30 DIAGNOSIS — Z79899 Other long term (current) drug therapy: Secondary | ICD-10-CM | POA: Diagnosis not present

## 2021-04-03 ENCOUNTER — Encounter: Payer: Self-pay | Admitting: Family Medicine

## 2021-04-03 ENCOUNTER — Telehealth: Payer: Self-pay | Admitting: Family Medicine

## 2021-04-03 NOTE — Telephone Encounter (Signed)
I received a fax from [facility] that patient was recently in the behavioral health hospital at [facility] Summary as follows:  Again our working diagnosis was depression with psychosis versus cannabis induced schizophreniform condition versus some combination of the 2 and varying degrees, so she was started on neuroprotective measures to include metabolize B vitamins in the prenatal form and omega-3's. Her homocystine level was high normal at 13.4 but since it was not significantly elevated this was a good sign that she probably was not going to convert to a permanent type of psychotic disorder.  Her Prozac was continued and we used clonazepam in the daytime and nighttime to help calm her down and treat the excessive anxiety and ruminations, and Risperdal for the unusual thoughts/ruminations, that bordered on delusion, and these measures all help very quickly. By the date of the fifth she was much improved, by the date of the sixth that she had recalibrated to what was very close to if not her baseline. On this date she was alert oriented and cooperative affect was appropriate she was no longer distressed and tearful, she was coherent and goal-directed and had no signs or symptoms of psychosis. The antipsychotic can be temporary hopefully med adjustments as above   I have sent her a MyChart message following up with her

## 2021-04-05 NOTE — Progress Notes (Signed)
[facility] [address] [place], [place] [address] [phone]  Date:  04/06/2021   Name:  Alexis Dixon   DOB:  10/29/1995   MRN:  [id number]  PCP:  Pearline Cables, MD    Chief Complaint: Hospitalization Follow-up (Feeling better) and OTHER (Discuss mental health)   History of Present Illness:  Alexis Dixon is a 25 y.o. very pleasant female patient who presents with the following:  Patient seen today for follow-up from recent hospitalization I saw her in the office recently, 7/29 with illness-nonspecific, likely viral.  She was not treated with prednisone She was admitted to behavioral health through [facility], date of admission 8/4 and discharged 8/6   Alexis Dixon is a 25 y.o. female who has a previous history of generalized anxiety, depression, and synthetic cannabis usage in the form of Gummies daily for the past year.  The patient presented with her husband through the emergency department on 8/3 in a disorganized state, in need of inpatient stabilization. 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  Hospital Course:  As discussed the patient was admitted involuntarily she was initially quite distressed and anxious to the extent that she was nonfunctional, very tearful and had difficulty collecting her thoughts and answering questions. Acutely she was given some as needed medications to include hydroxyzine and olanzapine.  Again our working diagnosis was depression with psychosis versus cannabis induced schizophreniform condition versus some combination of the 2 and varying degrees, so she was started on neuroprotective measures to include metabolize B vitamins in the prenatal form and omega-3's. Her homocystine level was high normal at 13.4 but since it was not significantly elevated this was a good  sign that she probably was not going to convert to a permanent type of psychotic disorder.  Her Prozac was continued and we used clonazepam in the daytime and nighttime to help calm her down and treat the excessive anxiety and ruminations, and Risperdal for the unusual thoughts/ruminations, that bordered on delusion, and these measures all help very quickly. By the date of the fifth she was much improved, by the date of the sixth that she had recalibrated to what was very close to if not her baseline. On this date she was alert oriented and cooperative affect was appropriate she was no longer distressed and tearful, she was coherent and goal-directed and had no signs or symptoms of psychosis. The antipsychotic can be temporary hopefully med adjustments as above   We discussed her recent hospital admission.   Alexis Dixon notes that she is feeling much better and basically back to normal.  She is concerned that she may have bipolar disorder ;pt notes that prior to her admission she was only sleeping maybe 4 hours a night. She was using sleep aids, but just did not feel tired.  She also admits to feelings of euphoria, unusual spending and other behaviors which may indicate mania She is sleeping much better now with the clonazepam She notes that her sister (younger) has bipolar disorder and had a similar episode 6 months ago Her sister is using depakote   She is using prozac, risperdal and clonazepam at bedtime Her anxiety is under good control Risperdal 1mg  at bedtime  She continues to use the synthetic cannabis Gummies " so I can eat" I advised her best to stop using this, they could contribute to  mood instability  This point Kimmarie feels that she is safe, she denies any suicidal ideation.  Her husband is supportive Patient Active Problem List   Diagnosis Date Noted   Pre-diabetes 12/30/2019   Seasonal and perennial allergic rhinitis 04/09/2019   Chronic idiopathic urticaria 04/09/2019   Gastroesophageal  reflux disease 04/09/2019   Mild intermittent asthma without complication 09/23/2017   Dermographia 09/23/2017   Wheezing 11/29/2016   Other seasonal allergic rhinitis 01/24/2016   Anaphylactic shock due to adverse food reaction 01/24/2016   Seasonal allergic conjunctivitis 01/24/2016   Headache(784.0) 07/05/2013    Past Medical History:  Diagnosis Date   Allergy    Tree Nuts ; Claritin D   Asthma    childhood; Albuterol and Singulair; no hospitalizations and ED visits.   Eczema    Headache(784.0)     Past Surgical History:  Procedure Laterality Date   WISDOM TOOTH EXTRACTION      Social History   Tobacco Use   Smoking status: Never   Smokeless tobacco: Never  Vaping Use   Vaping Use: Never used  Substance Use Topics   Alcohol use: Yes    Comment: socialy   Drug use: No    Family History  Problem Relation Age of Onset   Diabetes Mother    Hyperlipidemia Maternal Grandmother    Hypertension Maternal Grandmother    Diabetes Maternal Grandmother    Cancer Maternal Grandmother        breast   Asthma Paternal Grandmother    Asthma Sister    Eczema Maternal Grandfather     Allergies  Allergen Reactions   Other Swelling    Tree Nuts     Medication list has been reviewed and updated.  Current Outpatient Medications on File Prior to Visit  Medication Sig Dispense Refill   Adapalene 0.3 % gel Apply topically at bedtime 45 g 6   albuterol (PROVENTIL HFA;VENTOLIN HFA) 108 (90 Base) MCG/ACT inhaler Inhale 2 puffs into the lungs every 6 (six) hours as needed for wheezing or shortness of breath. Reported on 08/16/2015 18 g 1   clonazePAM (KLONOPIN) 1 MG tablet Take 1 mg by mouth at bedtime as needed.     FLUoxetine (PROZAC) 20 MG capsule Take 1 capsule (20 mg total) by mouth daily. Increase to 40 mg after 2 weeks if desired 60 capsule 3   FLUoxetine (PROZAC) 20 MG tablet Take 40 mg by mouth daily.     fluticasone (FLONASE) 50 MCG/ACT nasal spray 2 sprays per nostril  once daily if needed for stuffy nose. 16 g 5   fluticasone (FLOVENT HFA) 110 MCG/ACT inhaler 2 puffs twice daily with spacer to prevent coughing or wheezing. 1 Inhaler 5   levocetirizine (XYZAL) 5 MG tablet TAKE 1 TABLET BY MOUTH EVERY EVENING.*NEED OFFICE VISIT FOR ADDITIONAL REFILLS PER MD* 30 tablet 0   omeprazole (PRILOSEC) 20 MG capsule Take 1 capsule (20 mg total) by mouth daily. 30 capsule 0   ondansetron (ZOFRAN ODT) 4 MG disintegrating tablet Take 1 tablet (4 mg total) by mouth every 8 (eight) hours as needed for nausea or vomiting. 30 tablet 0   Spacer/Aero-Holding Chambers DEVI Use as directed 1 Device 1   SUMAtriptan (IMITREX) 100 MG tablet Take 1 tablet (100 mg total) by mouth every 2 (two) hours as needed for migraine. May repeat in 2 hours if headache persists or recurs. 8 tablet 5   No current facility-administered medications on file prior to visit.    Review of  Systems:  As per HPI- otherwise negative.   Physical Examination: Vitals:   04/06/21 1427  BP: 122/70  Pulse: 93  Resp: 17  Temp: 97.9 F (36.6 C)  SpO2: 98%   Vitals:   04/06/21 1427  Weight: 174 lb (78.9 kg)  Height: 5\' 3"  (1.6 m)   Body mass index is 30.82 kg/m. Ideal Body Weight: Weight in (lb) to have BMI = 25: 140.8  GEN: no acute distress.  Looks well, calm and normal behavior HEENT: Atraumatic, Normocephalic.  Ears and Nose: No external deformity. CV: RRR, No M/G/R. No JVD. No thrill. No extra heart sounds. PULM: CTA B, no wheezes, crackles, rhonchi. No retractions. No resp. distress. No accessory muscle use. EXTR: No c/c/e PSYCH: Normally interactive. Conversant.    Assessment and Plan: Hospital discharge follow-up - Plan: Ambulatory referral to Psychiatry  Bipolar affective disorder, current episode hypomanic (HCC) - Plan: Ambulatory referral to Psychiatry, risperiDONE (RISPERDAL) 1 MG tablet  Yeast vaginitis - Plan: fluconazole (DIFLUCAN) 150 MG tablet Patient seen today for  follow-up from recent hospitalization.  Working diagnosis during admission was depression with psychosis versus cannabis induced schizophreniform episode. However, her follow-up note from 8/9 mentions bipolar disorder, the patient also feels this is likely to be her diagnosis.  Her history is consistent  I have placed a referral to psychiatry.  In the time being we will continue Risperdal 1 mg, I asked her to decrease Prozac to 20 mg in case this could be driving mania. She agrees to keep me closely updated as to her progress and will seek care if not doing okay  Also prescribed Diflucan for yeast vaginitis This visit occurred during the SARS-CoV-2 public health emergency.  Safety protocols were in place, including screening questions prior to the visit, additional usage of staff PPE, and extensive cleaning of exam room while observing appropriate contact time as indicated for disinfecting solutions.   Signed 10/9, MD

## 2021-04-06 ENCOUNTER — Other Ambulatory Visit (HOSPITAL_BASED_OUTPATIENT_CLINIC_OR_DEPARTMENT_OTHER): Payer: Self-pay

## 2021-04-06 ENCOUNTER — Ambulatory Visit: Payer: 59 | Admitting: Family Medicine

## 2021-04-06 ENCOUNTER — Other Ambulatory Visit: Payer: Self-pay

## 2021-04-06 ENCOUNTER — Encounter: Payer: Self-pay | Admitting: Family Medicine

## 2021-04-06 VITALS — BP 122/70 | HR 93 | Temp 97.9°F | Resp 17 | Ht 63.0 in | Wt 174.0 lb

## 2021-04-06 DIAGNOSIS — B373 Candidiasis of vulva and vagina: Secondary | ICD-10-CM

## 2021-04-06 DIAGNOSIS — B3731 Acute candidiasis of vulva and vagina: Secondary | ICD-10-CM

## 2021-04-06 DIAGNOSIS — Z09 Encounter for follow-up examination after completed treatment for conditions other than malignant neoplasm: Secondary | ICD-10-CM

## 2021-04-06 DIAGNOSIS — F310 Bipolar disorder, current episode hypomanic: Secondary | ICD-10-CM | POA: Diagnosis not present

## 2021-04-06 MED ORDER — FLUCONAZOLE 150 MG PO TABS
150.0000 mg | ORAL_TABLET | Freq: Once | ORAL | 0 refills | Status: AC
Start: 2021-04-06 — End: 2021-04-07
  Filled 2021-04-06: qty 2, 7d supply, fill #0

## 2021-04-06 MED ORDER — RISPERIDONE 1 MG PO TABS
1.0000 mg | ORAL_TABLET | Freq: Every day | ORAL | 1 refills | Status: DC
Start: 2021-04-06 — End: 2021-05-22
  Filled 2021-04-06 (×2): qty 30, 30d supply, fill #0
  Filled 2021-05-07: qty 30, 30d supply, fill #1

## 2021-04-06 NOTE — Patient Instructions (Signed)
It was good to see you today- I am so sorry you had this scary episode!    Please reduce prozac to 20 mg Continue risperidone at 1mg  daily Continue clonazepam 1mg  at bedtime Ok to take 1/2 tablet during the day if needed for anxiety or panic- let me know when you need more  I would recommend coming off MJ products- they may cause increased instability.  Also avoid alcohol  Try to stick to a regular daily routine- get up the same time, exercise, meals the same time, bed the same time.  Getting good sleep is paramount to your mental health!    I have placed a psychiatry referral for you.  Please let me know if you don't hear from them soon or if anything changes in the meantime

## 2021-04-10 ENCOUNTER — Other Ambulatory Visit (HOSPITAL_BASED_OUTPATIENT_CLINIC_OR_DEPARTMENT_OTHER): Payer: Self-pay

## 2021-05-02 ENCOUNTER — Other Ambulatory Visit (HOSPITAL_BASED_OUTPATIENT_CLINIC_OR_DEPARTMENT_OTHER): Payer: Self-pay

## 2021-05-02 ENCOUNTER — Other Ambulatory Visit: Payer: Self-pay | Admitting: Family Medicine

## 2021-05-02 DIAGNOSIS — R112 Nausea with vomiting, unspecified: Secondary | ICD-10-CM

## 2021-05-02 MED ORDER — ONDANSETRON 4 MG PO TBDP
4.0000 mg | ORAL_TABLET | Freq: Three times a day (TID) | ORAL | 0 refills | Status: DC | PRN
  Filled 2021-05-02: qty 30, 10d supply, fill #0

## 2021-05-02 MED ORDER — CLONAZEPAM 1 MG PO TABS
1.0000 mg | ORAL_TABLET | Freq: Every evening | ORAL | 0 refills | Status: DC | PRN
  Filled 2021-05-02: qty 30, 30d supply, fill #0

## 2021-05-08 ENCOUNTER — Other Ambulatory Visit (HOSPITAL_BASED_OUTPATIENT_CLINIC_OR_DEPARTMENT_OTHER): Payer: Self-pay

## 2021-05-22 ENCOUNTER — Other Ambulatory Visit (HOSPITAL_BASED_OUTPATIENT_CLINIC_OR_DEPARTMENT_OTHER): Payer: Self-pay

## 2021-05-22 ENCOUNTER — Telehealth (INDEPENDENT_AMBULATORY_CARE_PROVIDER_SITE_OTHER): Payer: 59 | Admitting: Psychiatry

## 2021-05-22 ENCOUNTER — Encounter (HOSPITAL_COMMUNITY): Payer: Self-pay | Admitting: Psychiatry

## 2021-05-22 DIAGNOSIS — F3130 Bipolar disorder, current episode depressed, mild or moderate severity, unspecified: Secondary | ICD-10-CM | POA: Diagnosis not present

## 2021-05-22 DIAGNOSIS — F411 Generalized anxiety disorder: Secondary | ICD-10-CM | POA: Diagnosis not present

## 2021-05-22 DIAGNOSIS — F5102 Adjustment insomnia: Secondary | ICD-10-CM

## 2021-05-22 MED ORDER — FLUOXETINE HCL 10 MG PO CAPS
10.0000 mg | ORAL_CAPSULE | Freq: Every day | ORAL | 2 refills | Status: DC
  Filled 2021-05-22: qty 30, 30d supply, fill #0

## 2021-05-22 MED ORDER — ARIPIPRAZOLE 10 MG PO TABS
10.0000 mg | ORAL_TABLET | Freq: Every day | ORAL | 0 refills | Status: DC
  Filled 2021-05-22: qty 30, 30d supply, fill #0

## 2021-05-22 NOTE — Progress Notes (Signed)
Psychiatric Initial Adult Assessment   Patient Identification: Alexis Dixon MRN:  [id number] Date of Evaluation:  05/22/2021 Referral Source Dr. Patsy Lager Chief Complaint:  [name] care depression, mania  Visit Diagnosis:    ICD-10-CM   1. Bipolar I disorder, most recent episode depressed (HCC)  F31.30     2. GAD (generalized anxiety disorder)  F41.1     Virtual Visit via Video Note  I connected with Alexis Dixon on 05/22/21 at 11:00 AM EDT by a video enabled telemedicine application and verified that I am speaking with the correct person using two identifiers.  Location: Patient: home with husband Provider: home office   I discussed the limitations of evaluation and management by telemedicine and the availability of in person appointments. The patient expressed understanding and agreed to proceed.      I discussed the assessment and treatment plan with the patient. The patient was provided an opportunity to ask questions and all were answered. The patient agreed with the plan and demonstrated an understanding of the instructions.   The patient was advised to call back or seek an in-person evaluation if the symptoms worsen or if the condition fails to improve as anticipated.  I provided 50 minutes of non-face-to-face time during this encounter including chart review, documentation   History of Present Illness: Patient is a 25 years old currently married African-American female referred by primary care physician to establish care for possible bipolar she has been admitted recent past 4 days at [facility] with elevated mood talking to herself confusion and possible manic episode.  Patient works as a Engineer, civil (consulting) and a retirement facility.  She has been experiencing increased stress because of COVID years and also work stress lately she has joined a school or classes and her husband has noticed that she was having takes her anxiety and confusion or talking about things or repeating stuff and  feeling anxiety for which reason she got admitted in the hospital with a possible episode of mania with increased racing thoughts increase mind racing elevated mood distraction and doing stuff which she could not finish her checking on things and acting as if she is in panic or some paranoia she was started on Risperdal Prior to admission she was doing some risky things including spending money and starting some projects which she was not able to finish  In general since hospital admission there is some improvement she is not manic or elevated mood she still feels down having some down days and decreased energy not wanting to get out of bed at times or socializing.  She has noticed engorgement of breast and galacturia.  Her sleep is improved with some Klonopin use at night.  Her primary care has decreased her Prozac from 40 to 20 mg considering it may have contributed to the mania.  She has noticed since the Prozac has been cut down she is feeling more down depressed and feeling withdrawn does not endorse hallucination her paranoia is not severe but in general she wants to withdraw and not do the things that she was doing before but not checking on things or acting as if she is excessively anxious or in panic  She has stopped CBD Gummies she still drinks 1 shot every night or more so on the weekend  Does not endorse hallucinations does endorse worries excessive worries of possible related to her work stress as a Engineer, civil (consulting) in a retirement facility  Aggravating factors; work stress.  Seeing people die and that nursing  or retirement facility  Modifying factors husband, her dog her plants  Duration most of her late teenage life to recurrent      Past Psychiatric History: depression  Previous Psychotropic Medications: Yes   Substance Abuse History in the last 12 months:  Yes.    Consequences of Substance Abuse: Use of Gummies CBD  Past Medical History:  Past Medical History:  Diagnosis Date    Allergy    Tree Nuts ; Claritin D   Asthma    childhood; Albuterol and Singulair; no hospitalizations and ED visits.   Eczema    Headache(784.0)     Past Surgical History:  Procedure Laterality Date   WISDOM TOOTH EXTRACTION      Family Psychiatric History: Sister Bipolar  Family History:  Family History  Problem Relation Age of Onset   Diabetes Mother    Hyperlipidemia Maternal Grandmother    Hypertension Maternal Grandmother    Diabetes Maternal Grandmother    Cancer Maternal Grandmother        breast   Asthma Paternal Grandmother    Asthma Sister    Eczema Maternal Grandfather     Social History:   Social History   Socioeconomic History   Marital status: Married    Spouse name: Not on file   Number of children: Not on file   Years of education: 9   Highest education level: Not on file  Occupational History   Not on file  Tobacco Use   Smoking status: Never   Smokeless tobacco: Never  Vaping Use   Vaping Use: Never used  Substance and Sexual Activity   Alcohol use: Yes    Comment: socialy   Drug use: No   Sexual activity: Never  Other Topics Concern   Not on file  Social History Narrative   Marital Status: Single ; not dating   Living Situation: Lives with  Parents, sister (61)   Education: 11th Grade ([employer]) ; makes ABs; favorite subject English; career plans nursing.  No fails or being held back.   Tobacco Use/Exposure:  None    Diet:  Regular   Favorite Subject:  History    Hobbies:  Playing [employer]; reading; into books.  Teenage books.  Comic books.            Social Determinants of Health   Financial Resource Strain: Not on file  Food Insecurity: Not on file  Transportation Needs: Not on file  Physical Activity: Not on file  Stress: Not on file  Social Connections: Not on file    Additional Social History: Grew up with parents and sister, says have repressed some memories from past didn't elaborate today Has  sufferred from depression since high school but not treated with psychiatry, mostly with pcp Married, has no kids  Allergies:   Allergies  Allergen Reactions   Other Swelling    Tree Nuts     Metabolic Disorder Labs: Lab Results  Component Value Date   HGBA1C 5.8 10/07/2019   No results found for: PROLACTIN Lab Results  Component Value Date   CHOL 117 10/07/2019   TRIG 38.0 10/07/2019   HDL 48.20 10/07/2019   CHOLHDL 2 10/07/2019   VLDL 7.6 10/07/2019   LDLCALC 61 10/07/2019   LDLCALC 49 01/27/2018   Lab Results  Component Value Date   TSH 2.91 10/07/2019    Therapeutic Level Labs: No results found for: LITHIUM No results found for: CBMZ No results found for: VALPROATE  Current Medications: Current Outpatient Medications  Medication Sig Dispense Refill   ARIPiprazole (ABILIFY) 10 MG tablet Take 1 tablet (10 mg total) by mouth daily. Stop risperdal, starting abilify 30 tablet 0   FLUoxetine (PROZAC) 10 MG capsule Take 1 capsule (10 mg total) by mouth daily. This is in addition to 20mg . Total dose is now 30mg  30 capsule 2   Adapalene 0.3 % gel Apply topically at bedtime 45 g 6   albuterol (PROVENTIL HFA;VENTOLIN HFA) 108 (90 Base) MCG/ACT inhaler Inhale 2 puffs into the lungs every 6 (six) hours as needed for wheezing or shortness of breath. Reported on 08/16/2015 18 g 1   clonazePAM (KLONOPIN) 1 MG tablet Take 1 mg by mouth at bedtime as needed.     clonazePAM (KLONOPIN) 1 MG tablet Take 1 tablet (1 mg total) by mouth at bedtime as needed for insomnia. 30 tablet 0   FLUoxetine (PROZAC) 20 MG capsule Take 1 capsule (20 mg total) by mouth daily. Increase to 40 mg after 2 weeks if desired 60 capsule 3   fluticasone (FLONASE) 50 MCG/ACT nasal spray 2 sprays per nostril once daily if needed for stuffy nose. 16 g 5   fluticasone (FLOVENT HFA) 110 MCG/ACT inhaler 2 puffs twice daily with spacer to prevent coughing or wheezing. 1 Inhaler 5   levocetirizine (XYZAL) 5 MG tablet  TAKE 1 TABLET BY MOUTH EVERY EVENING.*NEED OFFICE VISIT FOR ADDITIONAL REFILLS PER MD* 30 tablet 0   omeprazole (PRILOSEC) 20 MG capsule Take 1 capsule (20 mg total) by mouth daily. 30 capsule 0   ondansetron (ZOFRAN ODT) 4 MG disintegrating tablet Take 1 tablet (4 mg total) by mouth every 8 (eight) hours as needed for nausea or vomiting. 30 tablet 0   Spacer/Aero-Holding Chambers DEVI Use as directed 1 Device 1   SUMAtriptan (IMITREX) 100 MG tablet Take 1 tablet (100 mg total) by mouth every 2 (two) hours as needed for migraine. May repeat in 2 hours if headache persists or recurs. 8 tablet 5   No current facility-administered medications for this visit.     Psychiatric Specialty Exam: Review of Systems  Cardiovascular:  Negative for chest pain.  Psychiatric/Behavioral:  Positive for dysphoric mood. Negative for agitation and suicidal ideas.    There were no vitals taken for this visit.There is no height or weight on file to calculate BMI.  General Appearance: Casual  Eye Contact:  Fair  Speech:  Normal Rate  Volume:  Decreased  Mood:  Dysphoric  Affect:  Constricted  Thought Process:  Goal Directed  Orientation:  Full (Time, Place, and Person)  Thought Content:  Rumination  Suicidal Thoughts:  No  Homicidal Thoughts:  No  Memory:  Immediate;   Fair  Judgement:  Fair  Insight:  Shallow  Psychomotor Activity:  Decreased  Concentration:  Concentration: Fair  Recall:  Fair  Fund of Knowledge:Fair  Language: Good  Akathisia:  No  Handed:   AIMS (if indicated):  no involuntary movements  Assets:  Financial Resources/Insurance Housing Social Support  ADL's:  Intact  Cognition: WNL  Sleep:   variable   Screenings: Office Visit from 03/24/2021 in [place] 03/26/2021 at [facility] [place] Office Visit from 10/07/2019 in [facility] at [facility] 12/05/2019 Office Visit from 01/27/2018 in [facility] at [facility] Office Visit from 05/02/2016 in Primary Care at [facility] Visit from 03/07/2016 in Primary Care at [facility]  PHQ-2 Total Score 0 6 0 0 0  PHQ-9 Total Score -- 24 -- -- --      Flowsheet Row ED from 03/25/2021 in [facility] EMERGENCY DEPARTMENT  C-SSRS RISK CATEGORY No Risk       Assessment and Plan: As follows  Bipolar disorder current episode depressed; possible that sleep deprivation and stressors may have triggered a possible manic episode leading to admission.  Considering Risperdal causing galactorrhea we will discontinue it and start her on Abilify 5 to 10 mg.  Increase back Prozac from 20 to 30 mg but not 40 mg since Prozac has kept her balance and depression and she is experiencing more depression since the dose has been decreased  Discussed to avoid CBD Gummies and alcohol and that she has agreed to  Generalized anxiety disorder; increase Prozac to 30 mg continue Klonopin half dose at night if need  Insomnia; sleep depression can be a trigger for an episode discussed sleep hygiene can continue half dose of Klonopin 1.5 mg and recommend therapy Patient to call her office to schedule therapy to work on coping skills possible prior stressors and trauma contributing to her depression.  Discussed stressors of her job she is planning to change or look for another dog if that is the main trigger for her anxiety and worries  Follow-up in 4 weeks preferably in office face-to-face   Thresa Ross, MD 9/26/202211:36 AM

## 2021-06-20 ENCOUNTER — Other Ambulatory Visit (HOSPITAL_BASED_OUTPATIENT_CLINIC_OR_DEPARTMENT_OTHER): Payer: Self-pay

## 2021-06-20 ENCOUNTER — Encounter (HOSPITAL_COMMUNITY): Payer: Self-pay | Admitting: Psychiatry

## 2021-06-20 ENCOUNTER — Ambulatory Visit (INDEPENDENT_AMBULATORY_CARE_PROVIDER_SITE_OTHER): Payer: 59 | Admitting: Psychiatry

## 2021-06-20 DIAGNOSIS — F5102 Adjustment insomnia: Secondary | ICD-10-CM

## 2021-06-20 DIAGNOSIS — F3130 Bipolar disorder, current episode depressed, mild or moderate severity, unspecified: Secondary | ICD-10-CM | POA: Diagnosis not present

## 2021-06-20 DIAGNOSIS — F411 Generalized anxiety disorder: Secondary | ICD-10-CM | POA: Diagnosis not present

## 2021-06-20 MED ORDER — ARIPIPRAZOLE 15 MG PO TABS
15.0000 mg | ORAL_TABLET | Freq: Every day | ORAL | 0 refills | Status: DC
  Filled 2021-06-20: qty 45, 45d supply, fill #0

## 2021-06-20 NOTE — Progress Notes (Signed)
[facility] Follow up Visit  Patient Identification: SANIYYAH ELSTER MRN:  [id number] Date of Evaluation:  06/20/2021 Referral Source Dr. Patsy Lager Chief Complaint:  [name] care depression, mania  Visit Diagnosis:    ICD-10-CM   1. Bipolar I disorder, most recent episode depressed (HCC)  F31.30     2. GAD (generalized anxiety disorder)  F41.1     3. Adjustment insomnia  F51.02       History of Present Illness: Patient is a 25 years old currently married African-American female initially referred by primary care physician to establish care for possible bipolar she has been admitted rfor 4 days at [facility] with elevated mood talking to herself confusion and possible manic episode.  Patient works as a Engineer, civil (consulting) and a retirement facility.  She has been experiencing increased stress because of COVID years  She was discharged with risperdal Last visit we changed to abilify due to galactorrhea and increased prozac to 30mg  for depressio  Some improvement in  anxiety, but still dysphoric and anxiety related to working in nursing home has seen peple die  They are looking for other options Scheduled for therapy as well Not psychotic and calm Husband supportive  She has stopped CBD Gummies she still drinks 1 shot every night or more so on the weekend  Does not endorse hallucinations does endorse worries excessive worries of possible related to her work stress as a in a retirement facility  Aggravating factors; work stress.  Seeing people die and that nursing or retirement facility  Modifying factors husband, her dog her plants  Duration most of her late teenage life to recurrent      Past Psychiatric History: depression  Previous Psychotropic Medications: Yes   Substance Abuse History in the last 12 months:  Yes.    Consequences of Substance Abuse: Use of Gummies CBD  Past Medical History:  Past Medical History:  Diagnosis Date   Allergy    Tree Nuts ; Claritin D   Asthma     childhood; Albuterol and Singulair; no hospitalizations and ED visits.   Eczema    Headache(784.0)     Past Surgical History:  Procedure Laterality Date   WISDOM TOOTH EXTRACTION      Family Psychiatric History: Sister Bipolar  Family History:  Family History  Problem Relation Age of Onset   Diabetes Mother    Hyperlipidemia Maternal Grandmother    Hypertension Maternal Grandmother    Diabetes Maternal Grandmother    Cancer Maternal Grandmother        breast   Asthma Paternal Grandmother    Asthma Sister    Eczema Maternal Grandfather     Social History:   Social History   Socioeconomic History   Marital status: Married    Spouse name: Not on file   Number of children: Not on file   Years of education: 9   Highest education level: Not on file  Occupational History   Not on file  Tobacco Use   Smoking status: Never   Smokeless tobacco: Never  Vaping Use   Vaping Use: Never used  Substance and Sexual Activity   Alcohol use: Yes    Comment: socialy   Drug use: No   Sexual activity: Never  Other Topics Concern   Not on file  Social History Narrative   Marital Status: Single ; not dating   Living Situation: Lives with  Parents, sister (32)   Education: 11th Grade ([employer] at 20) ; makes ABs; favorite subject  [place]; career plans nursing.  No fails or being held back.   Tobacco Use/Exposure:  None    Diet:  Regular   Favorite Subject:  History    Hobbies:  Playing [employer]; reading; into books.  Teenage books.  Comic books.            Social Determinants of Health   Financial Resource Strain: Not on file  Food Insecurity: Not on file  Transportation Needs: Not on file  Physical Activity: Not on file  Stress: Not on file  Social Connections: Not on file    Allergies:   Allergies  Allergen Reactions   Other Swelling    Tree Nuts     Metabolic Disorder Labs: Lab Results  Component Value Date   HGBA1C 5.8 10/07/2019   No results  found for: PROLACTIN Lab Results  Component Value Date   CHOL 117 10/07/2019   TRIG 38.0 10/07/2019   HDL 48.20 10/07/2019   CHOLHDL 2 10/07/2019   VLDL 7.6 10/07/2019   LDLCALC 61 10/07/2019   LDLCALC 49 01/27/2018   Lab Results  Component Value Date   TSH 2.91 10/07/2019    Therapeutic Level Labs: No results found for: LITHIUM No results found for: CBMZ No results found for: VALPROATE  Current Medications: Current Outpatient Medications  Medication Sig Dispense Refill   Adapalene 0.3 % gel Apply topically at bedtime 45 g 6   albuterol (PROVENTIL HFA;VENTOLIN HFA) 108 (90 Base) MCG/ACT inhaler Inhale 2 puffs into the lungs every 6 (six) hours as needed for wheezing or shortness of breath. Reported on 08/16/2015 18 g 1   ARIPiprazole (ABILIFY) 15 MG tablet Take 1 tablet (15 mg total) by mouth daily. Stop risperdal, starting abilify 45 tablet 0   clonazePAM (KLONOPIN) 1 MG tablet Take 1 mg by mouth at bedtime as needed.     clonazePAM (KLONOPIN) 1 MG tablet Take 1 tablet (1 mg total) by mouth at bedtime as needed for insomnia. 30 tablet 0   FLUoxetine (PROZAC) 20 MG capsule Take 1 capsule (20 mg total) by mouth daily. Increase to 40 mg after 2 weeks if desired 60 capsule 3   fluticasone (FLONASE) 50 MCG/ACT nasal spray 2 sprays per nostril once daily if needed for stuffy nose. 16 g 5   fluticasone (FLOVENT HFA) 110 MCG/ACT inhaler 2 puffs twice daily with spacer to prevent coughing or wheezing. 1 Inhaler 5   levocetirizine (XYZAL) 5 MG tablet TAKE 1 TABLET BY MOUTH EVERY EVENING.*NEED OFFICE VISIT FOR ADDITIONAL REFILLS PER MD* 30 tablet 0   omeprazole (PRILOSEC) 20 MG capsule Take 1 capsule (20 mg total) by mouth daily. 30 capsule 0   ondansetron (ZOFRAN ODT) 4 MG disintegrating tablet Take 1 tablet (4 mg total) by mouth every 8 (eight) hours as needed for nausea or vomiting. 30 tablet 0   Spacer/Aero-Holding Chambers DEVI Use as directed 1 Device 1   SUMAtriptan (IMITREX) 100 MG  tablet Take 1 tablet (100 mg total) by mouth every 2 (two) hours as needed for migraine. May repeat in 2 hours if headache persists or recurs. 8 tablet 5   No current facility-administered medications for this visit.     Psychiatric Specialty Exam: Review of Systems  Cardiovascular:  Negative for chest pain.  Psychiatric/Behavioral:  Positive for dysphoric mood. Negative for agitation and suicidal ideas.    There were no vitals taken for this visit.There is no height or weight on file to calculate BMI.  General Appearance: Casual  Eye Contact:  Fair  Speech:  Normal Rate  Volume:  Decreased  Mood:  Dysphoric  Affect:  Constricted  Thought Process:  Goal Directed  Orientation:  Full (Time, Place, and Person)  Thought Content:  Rumination  Suicidal Thoughts:  No  Homicidal Thoughts:  No  Memory:  Immediate;   Fair  Judgement:  Fair  Insight:  Shallow  Psychomotor Activity:  Decreased  Concentration:  Concentration: Fair  Recall:  Fair  Fund of Knowledge:Fair  Language: Good  Akathisia:  No  Handed:   AIMS (if indicated):  no involuntary movements  Assets:  Financial Resources/Insurance Housing Social Support  ADL's:  Intact  Cognition: WNL  Sleep:   variable   Screenings: PHQ2-9    Flowsheet Row Video Visit from 05/22/2021 in [facility] AT [place] Office Visit from 03/24/2021 in [facility] Office Visit from 10/07/2019 in [facility] Office Visit from 01/27/2018 in [facility] Office Visit from 05/02/2016 in Primary Care at [facility] Total Score 2 0 6 0 0  PHQ-9 Total Score 8 -- 24 -- --      Flowsheet Row Office Visit from 06/20/2021 in [facility] AT [place] Video Visit from 05/22/2021 in [facility] AT [place] ED from 03/25/2021 in [facility]  EMERGENCY DEPARTMENT  C-SSRS RISK CATEGORY No Risk No Risk No Risk       Assessment and Plan: As follows Prior documentation reviewed   Bipolar disorder current episode depressed; depressed, increase prozac to 40mg  , increase abilify to 15mg  Considering job change as that remains main trigger to depression, anxiety    Discussed to avoid CBD Gummies and alcohol and that she has agreed to  Generalized anxiety disorder;  increase prozac, scheduled therapy to work on coping skills and trauma related to work stress   Insomnia;  reviewed sleep hygiene, can take klonopine at night , avoid day time naps, can carry klonopine for anxiety prn Fu 32m  Time face to face with patient and husband 20 min in office   , MD 10/25/202210:50 AM Patient ID: Thresa Ross, female   DOB: 1995/10/06, 25 y.o.   MRN: 1995

## 2021-06-21 ENCOUNTER — Other Ambulatory Visit (HOSPITAL_BASED_OUTPATIENT_CLINIC_OR_DEPARTMENT_OTHER): Payer: Self-pay

## 2021-06-27 ENCOUNTER — Ambulatory Visit (INDEPENDENT_AMBULATORY_CARE_PROVIDER_SITE_OTHER): Payer: 59 | Admitting: Licensed Clinical Social Worker

## 2021-06-27 DIAGNOSIS — F411 Generalized anxiety disorder: Secondary | ICD-10-CM | POA: Diagnosis not present

## 2021-06-27 DIAGNOSIS — F5102 Adjustment insomnia: Secondary | ICD-10-CM

## 2021-06-27 DIAGNOSIS — F3130 Bipolar disorder, current episode depressed, mild or moderate severity, unspecified: Secondary | ICD-10-CM | POA: Diagnosis not present

## 2021-06-27 NOTE — Progress Notes (Signed)
Comprehensive Clinical Assessment (CCA) Note  06/27/2021 Alexis Dixon [id number]  Chief Complaint:  Chief Complaint  Patient presents with   Depression   Anxiety   trauma from work stress   Visit Diagnosis: Bipolar disorder most recent episode depressed, generalized anxiety disorder adjustment insomnia  CCA Biopsychosocial Intake/Chief Complaint:  not sure why here Dr. Alta Corning recommended. 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   Patient Reported Schizophrenia/Schizoaffective Diagnosis in Past: No   Strengths: not much  Preferences: anxiety, trauma, depression, stress management  Abilities: likes to cook, loves taking care of animals 8 lizards 3 dogs   Type of Services Patient Feels are Needed: therapy and med management   Initial Clinical Notes/Concerns: Treatment history-just got diagnosed with Bipolar in August had depression since high school.  Taking different antidepressant since graduated since high school. Discharged from Hospital at [facility] first week in August. Admitted because of a manic Episode.   Mental Health Symptoms Depression:   Change in energy/activity; Fatigue; Sleep (too much or little); Weight gain/loss; Difficulty Concentrating; Tearfulness; Worthlessness (gained weight since starting meds in August)   Duration of Depressive symptoms:  Greater than two weeks   Mania:   None   Anxiety:    Difficulty concentrating; Fatigue; Sleep; Worrying; Restlessness; Tension (Occasional panic attacks very rarely now feels tightness in her chest. Has hd this since graduated from high school)   Psychosis:   None   Duration of Psychotic symptoms: No data recorded  Trauma:   Re-experience of traumatic event; Avoids reminders of event; Difficulty  staying/falling asleep; Detachment from others; Emotional numbing; Irritability/anger; Hypervigilance (Occasionally sees some of her dad patient)   Obsessions:   None   Compulsions:   None   Inattention:   None   Hyperactivity/Impulsivity:   None   Oppositional/Defiant Behaviors:  No data recorded  Emotional Irregularity:   None   Other Mood/Personality Symptoms:  No data recorded   Mental Status Exam Appearance and self-care  Stature:   Small   Weight:   Overweight   Clothing:   Casual   Grooming:   Normal   Cosmetic use:   None   Posture/gait:   Rigid   Motor activity:   Not Remarkable   Sensorium  Attention:   Normal   Concentration:   Normal   Orientation:   X5   Recall/memory:   Normal   Affect and Mood  Affect:   Blunted   Mood:   Depressed   Relating  Eye contact:   Normal   Facial expression:   Responsive   Attitude toward examiner:   Cooperative   Thought and Language  Speech flow:  Normal   Thought content:   Appropriate to Mood and Circumstances   Preoccupation:   None   Hallucinations:   None   Organization:  No data recorded  [employer] of Knowledge:   Average   Intelligence:   Above Average   Abstraction:   Normal   Judgement:   Fair   Dance movement psychotherapist:   Realistic   Insight:   Fair   Decision Making:   Normal   Social Functioning  Social Maturity:   Isolates (tries to isolate)   Social Judgement:   Normal   Stress  Stressors:   Work; Surveyor, quantity; Grief/losses  Coping Ability:   Overwhelmed; Exhausted; Deficient supports   Skill Deficits:   Communication; Self-care   Supports:   Family; Friends/Service system (husband, family and friends. lives with husband)     Religion: Religion/Spirituality Are You A Religious Person?: Yes What is Your Religious Affiliation?: Jehovah's Witness How Might This Affect Treatment?: n/a  Leisure/Recreation: Leisure /  Recreation Do You Have Hobbies?: Yes Leisure and Hobbies: see above  Exercise/Diet: Exercise/Diet Do You Exercise?: Yes What Type of Exercise Do You Do?:  (crunches 20 minutes a day) How Many Times a Week Do You Exercise?: 4-5 times a week Have You Gained or Lost A Significant Amount of Weight in the Past Six Months?: Yes-Gained (medication causing her to gain doesn't like doesn't how gained.) Do You Follow a Special Diet?: No Do You Have Any Trouble Sleeping?: Yes Explanation of Sleeping Difficulties: Patient gets up and concerned about safety so to check stove, locks on the doors   CCA Employment/Education Employment/Work Situation: Employment / Work Situation Employment Situation: Employed Where is Patient Currently Employed?: [employer] How Long has Patient Been Employed?: 5 years Are You Satisfied With Your Job?: No Do You Work More Than One Job?: No (trying to find another job) Work Stressors: patient dying, socially with other coworkers not a fan of that some of the mean Patient's Job has Been Impacted by Current Illness: No What is the Longest Time Patient has Held a Job?: see above Has Patient ever Been in the [employer]?: No  Education: Education Is Patient Currently Attending School?: No (Wants to go back to school to further studies and nursing) Last Grade Completed: 13 (got her LPN) Name of High School: [employer] Did Garment/textile technologist From [employer]?: No Did You Product manager?: Yes Did You Attend Graduate School?: No What Was Your Major?: nursing Did You Have Any Special Interests In School?: liked English Did You Have An Individualized Education Program (IIEP): No Did You Have Any Difficulty At School?: No Patient's Education Has Been Impacted by Current Illness: No   CCA Family/Childhood History Family and Relationship History: Family history Marital status: Married Number of Years Married: 2 What types of issues is patient dealing with in the  relationship?: none Are you sexually active?: Yes What is your sexual orientation?: bi-sexual leaning Has your sexual activity been affected by drugs, alcohol, medication, or emotional stress?: medication Does patient have children?: No  Childhood History:  Childhood History By whom was/is the patient raised?: Both parents Additional childhood history information: childhood was stressful parents had high expectations for her Description of patient's relationship with caregiver when they were a child: got on better with mom than dad Patient's description of current relationship with people who raised him/her: about the same How were you disciplined when you got in trouble as a child/adolescent?: n/a Does patient have siblings?: Yes Number of Siblings: 1 Description of patient's current relationship with siblings: Adorah-21-not best friends but got each other's back Did patient suffer any verbal/emotional/physical/sexual abuse as a child?:  (don't think so maybe emotional unintentional from Dad-during childhood) Did patient suffer from severe childhood neglect?: No Has patient ever been sexually abused/assaulted/raped as an adolescent or adult?: No Was the patient ever a victim of a crime or a disaster?: No Witnessed domestic violence?: No Has patient been affected by domestic violence as an adult?: No  Child/Adolescent Assessment: N/A    CCA Substance Use Alcohol/Drug Use: Alcohol / Drug Use Pain Medications: n/a Prescriptions: see MAR Over the Counter: See MAR  History of alcohol / drug use?: No history of alcohol / drug abuse (used to take CBD gummies but don't anymore doctor advised not to take so stopped)                     ASAM's:  Six Dimensions of Multidimensional Assessment  Dimension 1:  Acute Intoxication and/or Withdrawal Potential:      Dimension 2:  Biomedical Conditions and Complications:      Dimension 3:  Emotional, Behavioral, or Cognitive Conditions  and Complications:     Dimension 4:  Readiness to Change:     Dimension 5:  Relapse, Continued use, or Continued Problem Potential:     Dimension 6:  Recovery/Living Environment:     ASAM Severity Score:    ASAM Recommended Level of Treatment:     Substance use Disorder (SUD)-n/a    Recommendations for Services/Supports/Treatments: therapy, med management    DSM5 Diagnoses: Patient Active Problem List   Diagnosis Date Noted   Pre-diabetes 12/30/2019   Seasonal and perennial allergic rhinitis 04/09/2019   Chronic idiopathic urticaria 04/09/2019   Gastroesophageal reflux disease 04/09/2019   Mild intermittent asthma without complication 09/23/2017   Dermographia 09/23/2017   Wheezing 11/29/2016   Other seasonal allergic rhinitis 01/24/2016   Anaphylactic shock due to adverse food reaction 01/24/2016   Seasonal allergic conjunctivitis 01/24/2016   Headache(784.0) 07/05/2013    Patient Centered Plan: Patient is on the following Treatment Plan(s):  Anxiety, Depression, and Low Self-Esteem, trauma from work-look at education on depression and trauma strategies that would help with trauma symptoms, complete treatment plan, coping   Referrals to Alternative Service(s): Referred to Alternative Service(s):   Place:   Date:   Time:    Referred to Alternative Service(s):   Place:   Date:   Time:    Referred to Alternative Service(s):   Place:   Date:   Time:    Referred to Alternative Service(s):   Place:   Date:   Time:     Coolidge Breeze, LCSW

## 2021-07-27 ENCOUNTER — Encounter (HOSPITAL_COMMUNITY): Payer: Self-pay | Admitting: Psychiatry

## 2021-07-27 ENCOUNTER — Other Ambulatory Visit: Payer: Self-pay

## 2021-07-27 ENCOUNTER — Other Ambulatory Visit (HOSPITAL_BASED_OUTPATIENT_CLINIC_OR_DEPARTMENT_OTHER): Payer: Self-pay

## 2021-07-27 ENCOUNTER — Ambulatory Visit (INDEPENDENT_AMBULATORY_CARE_PROVIDER_SITE_OTHER): Payer: 59 | Admitting: Psychiatry

## 2021-07-27 VITALS — HR 96 | Ht 62.5 in | Wt 179.0 lb

## 2021-07-27 DIAGNOSIS — F411 Generalized anxiety disorder: Secondary | ICD-10-CM

## 2021-07-27 DIAGNOSIS — F5102 Adjustment insomnia: Secondary | ICD-10-CM

## 2021-07-27 DIAGNOSIS — F3130 Bipolar disorder, current episode depressed, mild or moderate severity, unspecified: Secondary | ICD-10-CM

## 2021-07-27 MED ORDER — HYDROXYZINE PAMOATE 25 MG PO CAPS
25.0000 mg | ORAL_CAPSULE | Freq: Every evening | ORAL | 0 refills | Status: DC | PRN
  Filled 2021-07-27: qty 30, 30d supply, fill #0

## 2021-07-27 MED ORDER — ARIPIPRAZOLE 10 MG PO TABS
10.0000 mg | ORAL_TABLET | Freq: Every day | ORAL | 1 refills | Status: DC
  Filled 2021-07-27: qty 30, 30d supply, fill #0
  Filled 2021-08-24: qty 30, 30d supply, fill #1

## 2021-07-27 MED ORDER — FLUOXETINE HCL 20 MG PO CAPS
20.0000 mg | ORAL_CAPSULE | Freq: Every day | ORAL | 1 refills | Status: DC
  Filled 2021-07-27: qty 60, 60d supply, fill #0
  Filled 2021-08-29 – 2021-08-30 (×2): qty 60, 60d supply, fill #1

## 2021-07-27 MED ORDER — CLONAZEPAM 1 MG PO TABS
1.0000 mg | ORAL_TABLET | Freq: Every day | ORAL | 0 refills | Status: DC | PRN
  Filled 2021-07-27: qty 30, 30d supply, fill #0

## 2021-07-27 NOTE — Progress Notes (Signed)
[facility] Follow up Visit  Patient Identification: Alexis Dixon MRN:  [id number] Date of Evaluation:  07/27/2021 Referral Source Dr. Patsy Lager Chief Complaint:  [name] care depression, mania  Visit Diagnosis:    ICD-10-CM   1. Bipolar I disorder, most recent episode depressed (HCC)  F31.30     2. GAD (generalized anxiety disorder)  F41.1 FLUoxetine (PROZAC) 20 MG capsule    3. Adjustment insomnia  F51.02       History of Present Illness: Patient is a 25 years old currently married African-American female initially referred by primary care physician to establish care for possible bipolar she has been admitted rfor 4 days at [facility] with elevated mood talking to herself confusion and possible manic episode.  Patient works as a Engineer, civil (consulting) and a retirement facility.  She has been experiencing increased stress because of COVID years  She was discharged with risperdal , we changed to abilify due to galactorrhea   Last visit increased to 15mg  doing better but noticing some twitches or spasms, not a the moment, prozac was increased has helped anxiety Working stress is improved Still has night sleep issues and at times klonopine use for stress prn   Husband supportive   Aggravating factors; work  Seeing people die and that nursing or retirement facility  Modifying factors husband, dog, planta  Duration most of her late teenage life to recurrent      Past Psychiatric History: depression  Previous Psychotropic Medications: Yes   Substance Abuse History in the last 12 months:  Yes.    Consequences of Substance Abuse: Use of Gummies CBD  Past Medical History:  Past Medical History:  Diagnosis Date   Allergy    Tree Nuts ; Claritin D   Asthma    childhood; Albuterol and Singulair; no hospitalizations and ED visits.   Eczema    Headache(784.0)     Past Surgical History:  Procedure Laterality Date   WISDOM TOOTH EXTRACTION      Family Psychiatric History: Sister Bipolar  Family  History:  Family History  Problem Relation Age of Onset   Diabetes Mother    Hyperlipidemia Maternal Grandmother    Hypertension Maternal Grandmother    Diabetes Maternal Grandmother    Cancer Maternal Grandmother        breast   Asthma Paternal Grandmother    Asthma Sister    Eczema Maternal Grandfather     Social History:   Social History   Socioeconomic History   Marital status: Married    Spouse name: Not on file   Number of children: Not on file   Years of education: 9   Highest education level: Not on file  Occupational History   Not on file  Tobacco Use   Smoking status: Never   Smokeless tobacco: Never  Vaping Use   Vaping Use: Never used  Substance and Sexual Activity   Alcohol use: Yes    Comment: socialy   Drug use: No   Sexual activity: Never  Other Topics Concern   Not on file  Social History Narrative   Marital Status: Single ; not dating   Living Situation: Lives with  Parents, sister (3)   Education: 11th Grade ([employer] at 20) ; makes ABs; favorite subject English; career plans nursing.  No fails or being held back.   Tobacco Use/Exposure:  None    Diet:  Regular   Favorite Subject:  History    Hobbies:  Playing [employer]; reading; into books.  Teenage  books.  Comic books.            Social Determinants of Health   Financial Resource Strain: Not on file  Food Insecurity: Not on file  Transportation Needs: Not on file  Physical Activity: Not on file  Stress: Not on file  Social Connections: Not on file    Allergies:   Allergies  Allergen Reactions   Other Swelling    Tree Nuts     Metabolic Disorder Labs: Lab Results  Component Value Date   HGBA1C 5.8 10/07/2019   No results found for: PROLACTIN Lab Results  Component Value Date   CHOL 117 10/07/2019   TRIG 38.0 10/07/2019   HDL 48.20 10/07/2019   CHOLHDL 2 10/07/2019   VLDL 7.6 10/07/2019   LDLCALC 61 10/07/2019   LDLCALC 49 01/27/2018   Lab Results   Component Value Date   TSH 2.91 10/07/2019    Therapeutic Level Labs: No results found for: LITHIUM No results found for: CBMZ No results found for: VALPROATE  Current Medications: Current Outpatient Medications  Medication Sig Dispense Refill   Adapalene 0.3 % gel Apply topically at bedtime 45 g 6   albuterol (PROVENTIL HFA;VENTOLIN HFA) 108 (90 Base) MCG/ACT inhaler Inhale 2 puffs into the lungs every 6 (six) hours as needed for wheezing or shortness of breath. Reported on 08/16/2015 18 g 1   fluticasone (FLONASE) 50 MCG/ACT nasal spray 2 sprays per nostril once daily if needed for stuffy nose. 16 g 5   fluticasone (FLOVENT HFA) 110 MCG/ACT inhaler 2 puffs twice daily with spacer to prevent coughing or wheezing. 1 Inhaler 5   hydrOXYzine (VISTARIL) 25 MG capsule Take 1 capsule (25 mg total) by mouth at bedtime as needed for itching. 30 capsule 0   levocetirizine (XYZAL) 5 MG tablet TAKE 1 TABLET BY MOUTH EVERY EVENING.*NEED OFFICE VISIT FOR ADDITIONAL REFILLS PER MD* 30 tablet 0   omeprazole (PRILOSEC) 20 MG capsule Take 1 capsule (20 mg total) by mouth daily. 30 capsule 0   ondansetron (ZOFRAN ODT) 4 MG disintegrating tablet Take 1 tablet (4 mg total) by mouth every 8 (eight) hours as needed for nausea or vomiting. 30 tablet 0   Spacer/Aero-Holding Chambers DEVI Use as directed 1 Device 1   SUMAtriptan (IMITREX) 100 MG tablet Take 1 tablet (100 mg total) by mouth every 2 (two) hours as needed for migraine. May repeat in 2 hours if headache persists or recurs. 8 tablet 5   ARIPiprazole (ABILIFY) 10 MG tablet Take 1 tablet (10 mg total) by mouth daily. Stop risperdal, starting abilify 30 tablet 1   clonazePAM (KLONOPIN) 1 MG tablet Take 1 tablet (1 mg total) by mouth daily as needed. 30 tablet 0   FLUoxetine (PROZAC) 20 MG capsule Take 1 capsule (20 mg total) by mouth daily. Delete prior refills 60 capsule 1   No current facility-administered medications for this visit.      Psychiatric Specialty Exam: Review of Systems  Cardiovascular:  Negative for chest pain.  Psychiatric/Behavioral:  Negative for agitation and suicidal ideas.    Pulse 96, height 5' 2.5" (1.588 m), weight 179 lb (81.2 kg), SpO2 99 %.Body mass index is 32.22 kg/m.  General Appearance: Casual  Eye Contact:  Fair  Speech:  Normal Rate  Volume:  Decreased  Mood: some better  Affect:  Constricted  Thought Process:  Goal Directed  Orientation:  Full (Time, Place, and Person)  Thought Content:  Rumination  Suicidal Thoughts:  No  Homicidal  Thoughts:  No  Memory:  Immediate;   Fair  Judgement:  Fair  Insight:  Shallow  Psychomotor Activity:  Decreased  Concentration:  Concentration: Fair  Recall:  Fair  Fund of Knowledge:Fair  Language: Good  Akathisia:  No  Handed:   AIMS (if indicated):  no involuntary movements  Assets:  Financial Resources/Insurance Housing Social Support  ADL's:  Intact  Cognition: WNL  Sleep:   variable   Screenings: Insurance account manager from 06/27/2021 in [facility] AT [place] Video Visit from 05/22/2021 in [facility] AT [place] Office Visit from 03/24/2021 in [facility] at [facility] Office Visit from 10/07/2019 in [facility] Office Visit from 01/27/2018 in [facility]  PHQ-2 Total Score 4 2 0 6 0  PHQ-9 Total Score 16 8 -- 24 --      Flowsheet Row Office Visit from 07/27/2021 in [facility] AT [place] Counselor from 06/27/2021 in [facility] AT [place] Office Visit from 06/20/2021 in [facility] AT [place]  C-SSRS RISK CATEGORY Error: Q7 should not be populated when Q6 is No Low Risk No Risk       Assessment and Plan: As follows Prior documentation reviewed   Bipolar disorder  current episode depressed; better, says have spasms of legs at times, will reduce abilify to 10mg   She will call in a week let know if she wants to go back to 15mg  and we can add cogentin   Considering job change as that remains main trigger to depression, anxiety    Generalized anxiety disorder;  improved, continue prozac 40mg    Insomnia;  difficulty to sleep, add vistaril , reviewed sleep hygiene  Fu 28m.  Time face to face with patient : 15 min   , MD 12/1/202211:17 AM Patient ID: [id number], female   DOB: 21-Oct-1995, 25 y.o.   MRN: Quita Skye

## 2021-08-08 ENCOUNTER — Ambulatory Visit (INDEPENDENT_AMBULATORY_CARE_PROVIDER_SITE_OTHER): Payer: 59 | Admitting: Licensed Clinical Social Worker

## 2021-08-08 ENCOUNTER — Encounter (HOSPITAL_COMMUNITY): Payer: Self-pay

## 2021-08-08 DIAGNOSIS — F411 Generalized anxiety disorder: Secondary | ICD-10-CM

## 2021-08-08 DIAGNOSIS — F3130 Bipolar disorder, current episode depressed, mild or moderate severity, unspecified: Secondary | ICD-10-CM | POA: Diagnosis not present

## 2021-08-08 DIAGNOSIS — H5213 Myopia, bilateral: Secondary | ICD-10-CM | POA: Diagnosis not present

## 2021-08-08 NOTE — Plan of Care (Signed)
Patient participated in completion of treatment plan 

## 2021-08-08 NOTE — Progress Notes (Signed)
° °  THERAPIST PROGRESS NOTE  Session Time: 1:00 PM to 1:53 PM  Participation Level: Active  Behavioral Response: CasualAlertDysphoric  Type of Therapy: Individual Therapy  Treatment Goals addressed:  Decreased depression, decrease anxiety, cope Interventions: CBT, Solution Focused, Strength-based, Reframing, and Other: coping  Summary: Alexis Dixon is a 25 y.o. female who presents with more tired and moving slower. Usually working one or two days a week by choice. 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  Hard to push to look for work. Overwhelmed with situation anxiety of it all. Finances are a concern. Gets out of bed and tries to clean with the day.  Getting out of the hospital was start of depression. Family doesn't believe in mental illness. Family supportive but didn't understand. Husband did.  Explored negative aspects of bipolar.  Patient became tearful and sharing that depression started after hospitalization.  She says for 1 thing make her feel fatter.  She has side effect of blurred vision and nauseous she is to call Dr. If she wants medication for side effects.  Therapist noted blunted affect and patient says she thinks this is from medications as well.  Therapist provided perspective of taking time being patient and patient does think overwhelming to think about developing a treatment plan that will work for her.  Reviewed CBT information on depression patient will start by setting a goal for herself to help her be more active and handout on depression.  Completed treatment plan  Therapist reviewed symptoms, facilitated expression of thoughts and feelings assess therapeutic for patient to open up in session and feeling overwhelmed with bipolar diagnosis as well as stress coming from issues at work.  Therapist provided context for patient and reframe the  way for her to see that bipolar is like any other medical issue, and to look at treatment as a journey to find the right treatment plan for her, noting this takes time and that people with bipolar adjusted and are satisfied with her quality of life.  Feel patient needs to open up emotionally so she can work through these feelings so session helpful in facilitating that.  Also showed patient video titled the vicious cycle of depression for patient to understand how negative cognitions lead to feeling tired on energetic that lead to behaviors of withdrawal.  Noted we can look at both her thoughts and our actions.  We normally start with actions therapist added to treatment strategy of behavior activation just doing more will help her feel better finding activities that are simple in the morning, afternoon and evening.  Simplified it to just setting a goal for the day to help her encourage her to be more active.  Gave patient handout on depression will review with her next time.  Therapist provided active listening open questions, supportive interventions. Suicidal/Homicidal: No  Plan: Return again in 4 weeks.2.  Review handout on depression, look at other resources for depression such as therapist aid  Diagnosis: Axis I:  generalized anxiety disorder, bipolar disorder most recent episode depressed    Axis II: No diagnosis    Coolidge Breeze, LCSW 08/08/2021

## 2021-08-25 ENCOUNTER — Other Ambulatory Visit (HOSPITAL_BASED_OUTPATIENT_CLINIC_OR_DEPARTMENT_OTHER): Payer: Self-pay

## 2021-08-30 ENCOUNTER — Other Ambulatory Visit (HOSPITAL_BASED_OUTPATIENT_CLINIC_OR_DEPARTMENT_OTHER): Payer: Self-pay

## 2021-08-31 ENCOUNTER — Other Ambulatory Visit (HOSPITAL_COMMUNITY): Payer: Self-pay

## 2021-08-31 ENCOUNTER — Other Ambulatory Visit (HOSPITAL_BASED_OUTPATIENT_CLINIC_OR_DEPARTMENT_OTHER): Payer: Self-pay

## 2021-08-31 DIAGNOSIS — F411 Generalized anxiety disorder: Secondary | ICD-10-CM

## 2021-08-31 MED ORDER — FLUOXETINE HCL 20 MG PO CAPS
40.0000 mg | ORAL_CAPSULE | Freq: Every day | ORAL | 1 refills | Status: DC
  Filled 2021-08-31 (×2): qty 60, 30d supply, fill #0
  Filled 2021-09-27: qty 60, 30d supply, fill #1

## 2021-09-11 ENCOUNTER — Ambulatory Visit (HOSPITAL_COMMUNITY): Payer: 59 | Admitting: Licensed Clinical Social Worker

## 2021-09-27 ENCOUNTER — Other Ambulatory Visit (HOSPITAL_COMMUNITY): Payer: Self-pay

## 2021-09-27 ENCOUNTER — Other Ambulatory Visit: Payer: Self-pay | Admitting: Family Medicine

## 2021-09-27 ENCOUNTER — Other Ambulatory Visit (HOSPITAL_BASED_OUTPATIENT_CLINIC_OR_DEPARTMENT_OTHER): Payer: Self-pay

## 2021-09-27 DIAGNOSIS — R112 Nausea with vomiting, unspecified: Secondary | ICD-10-CM

## 2021-09-27 NOTE — Telephone Encounter (Signed)
Patient requesting refill on Abilify 10 MG  to be sent to [facility] in [place].   Jennel Mara Lesli Albee, CMA

## 2021-09-28 ENCOUNTER — Other Ambulatory Visit (HOSPITAL_BASED_OUTPATIENT_CLINIC_OR_DEPARTMENT_OTHER): Payer: Self-pay

## 2021-09-28 MED ORDER — ARIPIPRAZOLE 10 MG PO TABS
10.0000 mg | ORAL_TABLET | Freq: Every day | ORAL | 1 refills | Status: DC
  Filled 2021-09-28: qty 30, 30d supply, fill #0

## 2021-10-09 ENCOUNTER — Ambulatory Visit (INDEPENDENT_AMBULATORY_CARE_PROVIDER_SITE_OTHER): Payer: 59 | Admitting: Licensed Clinical Social Worker

## 2021-10-09 DIAGNOSIS — F3130 Bipolar disorder, current episode depressed, mild or moderate severity, unspecified: Secondary | ICD-10-CM | POA: Diagnosis not present

## 2021-10-09 NOTE — Progress Notes (Signed)
In person session at office  THERAPIST PROGRESS NOTE  Session Time: 10:00 AM to 10:50 AM  Participation Level: Active  Behavioral Response: CasualAlertEuthymic patient reports always being tired and did yield in session as well as some depression still  Type of Therapy: Individual Therapy  Treatment Goals addressed:  Decreased depression, decrease anxiety, cope Interventions: Solution Focused, Strength-based, Supportive, Reframing, and Other: coping  Summary: Alexis Dixon is a 26 y.o. female who presents with coming to terms with the diagnosis of bipolar more accepting of it. She is doing ok.  Processed with patient why she is doing okay explains hard to come to terms with her diagnosis but doing better with that.  We explored what are ways to help her get to that place and they include being okay with herself and not being defined by certain events in her life at the same time it is okay to have bipolar.  Cannot function successfully in life with bipolar actually has some advantages can give you lots of energy to accomplish things.  Noted we defined are worth and it is unconditional.  Noted difficult experiences can be one of the best ways that we can grow and worsen not defined by our difficult experiences.  Noted helpful quote "it is okay not to be okay".  Has depression but better able to get up and function. Husband encourages her and helps her to function.  Patient reports looking actively for jobs and therapist noted this is a positive sign for depression that she is goal oriented and being active and motivated.  Explored medications relates though has no energy to do anything on medications would like to look at diagnosis.  Reports always being tired.  Looked at in session and noted only 1 episode that possibly could be manic could be related to lack of sleep.  Therapist encouraged patient to learn from episode to get sleep as important thing to learn from experience. She qualifies for  diagnosis based on criteria noted difference between hypomania and mania with mania more severe impact on functioning and last for a week and noted patient does have those symptoms.  Explored with patient medications and talk to Dr. to look at other options for medications that would help more with depression and also continue to note good medications that fit her presentation.  Therapist input was that she could get diagnosis with Bipolar 1 manic episode but also needs to find medications that fit current symptoms.  Need to continue to explore patterns that help her know if it fits her diagnosis.  Patient describes being always tired in session she yawns a lot explored whether she was like this before hospitalization she said she was not and is getting enough sleep could also be med related.  Therapist encouraged patient to be a good advocate for herself she knows herself better than anyone so to review information with doctor and decide what is best for her.  Therapist provided active questions open listening supportive interventions  Suicidal/Homicidal: No  Plan: Return again in 4 weeks.2.  Look at self-esteem worksheet and depression worksheet processed feelings with patient in session  Diagnosis: Axis I: generalized anxiety disorder, bipolar disorder most recent episode depressed    Axis II: No diagnosis    Coolidge Breeze, LCSW 10/09/2021

## 2021-10-31 ENCOUNTER — Other Ambulatory Visit (HOSPITAL_BASED_OUTPATIENT_CLINIC_OR_DEPARTMENT_OTHER): Payer: Self-pay

## 2021-10-31 ENCOUNTER — Encounter (HOSPITAL_COMMUNITY): Payer: Self-pay | Admitting: Psychiatry

## 2021-10-31 ENCOUNTER — Ambulatory Visit (INDEPENDENT_AMBULATORY_CARE_PROVIDER_SITE_OTHER): Payer: 59 | Admitting: Psychiatry

## 2021-10-31 VITALS — BP 108/76 | Temp 98.3°F | Ht 63.0 in | Wt 186.0 lb

## 2021-10-31 DIAGNOSIS — F5102 Adjustment insomnia: Secondary | ICD-10-CM | POA: Diagnosis not present

## 2021-10-31 DIAGNOSIS — F3130 Bipolar disorder, current episode depressed, mild or moderate severity, unspecified: Secondary | ICD-10-CM

## 2021-10-31 DIAGNOSIS — F411 Generalized anxiety disorder: Secondary | ICD-10-CM | POA: Diagnosis not present

## 2021-10-31 MED ORDER — LAMOTRIGINE 25 MG PO TABS
25.0000 mg | ORAL_TABLET | Freq: Every day | ORAL | 0 refills | Status: DC
  Filled 2021-10-31: qty 60, 30d supply, fill #0

## 2021-10-31 MED ORDER — TRAZODONE HCL 50 MG PO TABS
50.0000 mg | ORAL_TABLET | Freq: Every day | ORAL | 0 refills | Status: DC
  Filled 2021-10-31: qty 30, 30d supply, fill #0

## 2021-10-31 MED ORDER — FLUOXETINE HCL 20 MG PO CAPS
40.0000 mg | ORAL_CAPSULE | Freq: Every day | ORAL | 1 refills | Status: DC
  Filled 2021-10-31: qty 60, 30d supply, fill #0

## 2021-10-31 NOTE — Progress Notes (Signed)
[facility] Follow up Visit  Patient Identification: Alexis Dixon MRN:  [id number] Date of Evaluation:  10/31/2021 Referral Source Dr. Patsy Lager Chief Complaint:  [name] care depression, mania  Visit Diagnosis:    ICD-10-CM   1. Bipolar I disorder, most recent episode depressed (HCC)  F31.30     2. GAD (generalized anxiety disorder)  F41.1 FLUoxetine (PROZAC) 20 MG capsule    3. Adjustment insomnia  F51.02       History of Present Illness: Patient is a 26 years old currently married African-American female initially referred by primary care physician to establish care for possible bipolar she has been admitted rfor 4 days at [facility] with elevated mood talking to herself confusion and possible manic episode.  Patient works as a Engineer, civil (consulting) and a retirement facility.  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    Consequences of Substance Abuse: Use of Gummies CBD  Past Medical History:  Past Medical History:  Diagnosis Date   Allergy    Tree Nuts ; Claritin D   Asthma    childhood; Albuterol and Singulair; no hospitalizations and ED visits.   Eczema    Headache(784.0)     Past Surgical History:  Procedure Laterality Date   WISDOM TOOTH EXTRACTION      Family Psychiatric History: Sister Bipolar  Family History:  Family History  Problem Relation Age of Onset   Diabetes Mother    Hyperlipidemia Maternal  Grandmother    Hypertension Maternal Grandmother    Diabetes Maternal Grandmother    Cancer Maternal Grandmother        breast   Asthma Paternal Grandmother    Asthma Sister    Eczema Maternal Grandfather     Social History:   Social History   Socioeconomic History   Marital status: Married    Spouse name: Not on file   Number of children: Not on file   Years of education: 9   Highest education level: Not on file  Occupational History   Not on file  Tobacco Use   Smoking status: Never   Smokeless tobacco: Never  Vaping Use   Vaping Use: Never used  Substance and Sexual Activity   Alcohol use: Yes    Comment: socialy   Drug use: No   Sexual activity: Never  Other Topics Concern   Not on file  Social History Narrative   Marital Status: Single ; not dating   Living Situation: Lives with  Parents, sister (29)   Education: 11th Grade ([employer]) ; makes ABs; favorite subject English; career plans nursing.  No fails or being held back.   Tobacco Use/Exposure:  None    Diet:  Regular   Favorite Subject:  History    Hobbies:  Playing [employer]; reading; into books.  Teenage books.  Comic books.            Social Determinants of Health   Financial Resource Strain: Not  on file  Food Insecurity: Not on file  Transportation Needs: Not on file  Physical Activity: Not on file  Stress: Not on file  Social Connections: Not on file    Allergies:   Allergies  Allergen Reactions   Other Swelling    Tree Nuts     Metabolic Disorder Labs: Lab Results  Component Value Date   HGBA1C 5.8 10/07/2019   No results found for: PROLACTIN Lab Results  Component Value Date   CHOL 117 10/07/2019   TRIG 38.0 10/07/2019   HDL 48.20 10/07/2019   CHOLHDL 2 10/07/2019   VLDL 7.6 10/07/2019   LDLCALC 61 10/07/2019   LDLCALC 49 01/27/2018   Lab Results  Component Value Date   TSH 2.91 10/07/2019    Therapeutic Level Labs: No results found for: LITHIUM No  results found for: CBMZ No results found for: VALPROATE  Current Medications: Current Outpatient Medications  Medication Sig Dispense Refill   lamoTRIgine (LAMICTAL) 25 MG tablet Take 1 tablet by mouth daily for 1 week, then increase to 2 tablets daily. 60 tablet 0   traZODone (DESYREL) 50 MG tablet Take 1 tablet (50 mg total) by mouth at bedtime. 30 tablet 0   Adapalene 0.3 % gel Apply topically at bedtime 45 g 6   albuterol (PROVENTIL HFA;VENTOLIN HFA) 108 (90 Base) MCG/ACT inhaler Inhale 2 puffs into the lungs every 6 (six) hours as needed for wheezing or shortness of breath. Reported on 08/16/2015 18 g 1   clonazePAM (KLONOPIN) 1 MG tablet Take 1 tablet (1 mg total) by mouth daily as needed. 30 tablet 0   FLUoxetine (PROZAC) 20 MG capsule Take 2 capsules (40 mg total) by mouth daily. 60 capsule 1   fluticasone (FLONASE) 50 MCG/ACT nasal spray 2 sprays per nostril once daily if needed for stuffy nose. 16 g 5   fluticasone (FLOVENT HFA) 110 MCG/ACT inhaler 2 puffs twice daily with spacer to prevent coughing or wheezing. 1 Inhaler 5   levocetirizine (XYZAL) 5 MG tablet TAKE 1 TABLET BY MOUTH EVERY EVENING.*NEED OFFICE VISIT FOR ADDITIONAL REFILLS PER MD* 30 tablet 0   omeprazole (PRILOSEC) 20 MG capsule Take 1 capsule (20 mg total) by mouth daily. 30 capsule 0   ondansetron (ZOFRAN ODT) 4 MG disintegrating tablet Take 1 tablet (4 mg total) by mouth every 8 (eight) hours as needed for nausea or vomiting. 30 tablet 0   Spacer/Aero-Holding Chambers DEVI Use as directed 1 Device 1   SUMAtriptan (IMITREX) 100 MG tablet Take 1 tablet (100 mg total) by mouth every 2 (two) hours as needed for migraine. May repeat in 2 hours if headache persists or recurs. 8 tablet 5   No current facility-administered medications for this visit.     Psychiatric Specialty Exam: Review of Systems  Constitutional:  Positive for fatigue.  Cardiovascular:  Negative for chest pain.  Psychiatric/Behavioral:  Positive  for sleep disturbance. Negative for agitation and suicidal ideas.    Blood pressure 108/76, temperature 98.3 F (36.8 C), height 5\' 3"  (1.6 m), weight 186 lb (84.4 kg).Body mass index is 32.95 kg/m.  General Appearance: Casual  Eye Contact:  Fair  Speech:  Normal Rate  Volume:  Decreased  Mood: somewhat subdued  Affect:  Constricted  Thought Process:  Goal Directed  Orientation:  Full (Time, Place, and Person)  Thought Content:  Rumination  Suicidal Thoughts:  No  Homicidal Thoughts:  No  Memory:  Immediate;   Fair  Judgement:  Fair  Insight:  Shallow  Psychomotor Activity:  Decreased  Concentration:  Concentration: Fair  Recall:  Fair  Fund of Knowledge:Fair  Language: Good  Akathisia:  No  Handed:   AIMS (if indicated):  no involuntary movements  Assets:  Financial Resources/Insurance Housing Social Support  ADL's:  Intact  Cognition: WNL  Sleep:   variable   Screenings: Insurance account manager from 06/27/2021 in [facility] AT [place] Video Visit from 05/22/2021 in [facility] AT [place] Office Visit from 03/24/2021 in [place] [facility] Office Visit from 10/07/2019 in [facility] Office Visit from 01/27/2018 in [facility]  PHQ-2 Total Score 4 2 0 6 0  PHQ-9 Total Score 16 8 -- 24 --      Flowsheet Row Office Visit from 10/31/2021 in [facility] AT [place] Office Visit from 07/27/2021 in [facility] AT [place] Counselor from 06/27/2021 in [facility] AT   C-SSRS RISK CATEGORY No Risk Error: Q7 should not be populated when Q6 is No Low Risk       Assessment and Plan: As follows  Prior documentation reviewed   Bipolar disorder current episode depressed; somewhat subdued, wants to change abilify,  will change to lamictal start small dose 25mg     Considering job change as that remains main trigger to depression, anxiety  overall some better dealing with job   Generalized anxiety disorder; fluctuates, continue prozac   Insomnia; goes to bed late, second shift work, poor sleep, start trazodone at night, work on sleep hygiene   Fu 1 -89m   Time face to face with patient : 15 min    Thresa Ross, MD 3/7/202310:43 AM Patient ID: Alexis Dixon, female   DOB: Apr 23, 1996, 26 y.o.   MRN: [id number]

## 2021-11-06 ENCOUNTER — Ambulatory Visit (HOSPITAL_COMMUNITY): Payer: 59 | Admitting: Licensed Clinical Social Worker

## 2021-11-12 DIAGNOSIS — Z20822 Contact with and (suspected) exposure to covid-19: Secondary | ICD-10-CM | POA: Diagnosis not present

## 2021-11-12 DIAGNOSIS — R52 Pain, unspecified: Secondary | ICD-10-CM | POA: Diagnosis not present

## 2021-11-12 DIAGNOSIS — H6693 Otitis media, unspecified, bilateral: Secondary | ICD-10-CM | POA: Diagnosis not present

## 2021-12-05 ENCOUNTER — Encounter (HOSPITAL_COMMUNITY): Payer: Self-pay | Admitting: Psychiatry

## 2021-12-05 ENCOUNTER — Other Ambulatory Visit (HOSPITAL_BASED_OUTPATIENT_CLINIC_OR_DEPARTMENT_OTHER): Payer: Self-pay

## 2021-12-05 ENCOUNTER — Ambulatory Visit (INDEPENDENT_AMBULATORY_CARE_PROVIDER_SITE_OTHER): Payer: 59 | Admitting: Psychiatry

## 2021-12-05 VITALS — BP 118/74 | Temp 98.6°F | Ht 63.0 in | Wt 187.0 lb

## 2021-12-05 DIAGNOSIS — F5102 Adjustment insomnia: Secondary | ICD-10-CM | POA: Diagnosis not present

## 2021-12-05 DIAGNOSIS — F3130 Bipolar disorder, current episode depressed, mild or moderate severity, unspecified: Secondary | ICD-10-CM

## 2021-12-05 DIAGNOSIS — F411 Generalized anxiety disorder: Secondary | ICD-10-CM

## 2021-12-05 MED ORDER — FLUOXETINE HCL 20 MG PO CAPS
40.0000 mg | ORAL_CAPSULE | Freq: Every day | ORAL | 1 refills | Status: DC
  Filled 2021-12-05: qty 60, 30d supply, fill #0
  Filled 2022-01-04: qty 60, 30d supply, fill #1

## 2021-12-05 MED ORDER — LAMOTRIGINE 25 MG PO TABS
50.0000 mg | ORAL_TABLET | Freq: Every day | ORAL | 2 refills | Status: DC
  Filled 2021-12-05: qty 60, 30d supply, fill #0
  Filled 2022-01-04: qty 60, 30d supply, fill #1
  Filled 2022-02-15: qty 60, 30d supply, fill #2

## 2021-12-05 MED ORDER — LAMOTRIGINE 25 MG PO TABS
25.0000 mg | ORAL_TABLET | Freq: Every day | ORAL | 2 refills | Status: DC
  Filled 2021-12-05: qty 60, 60d supply, fill #0

## 2021-12-05 MED ORDER — CLONAZEPAM 1 MG PO TABS
1.0000 mg | ORAL_TABLET | Freq: Every day | ORAL | 0 refills | Status: DC | PRN
  Filled 2021-12-05: qty 30, 30d supply, fill #0

## 2021-12-05 NOTE — Progress Notes (Signed)
[facility] Follow up Visit ? ?Patient Identification: Alexis Dixon ?MRN:  [id number] ?Date of Evaluation:  12/05/2021 ?Referral Source Dr. Patsy Lager ?Chief Complaint:  estalish care depression, mania  ?Visit Diagnosis:  ?  ICD-10-CM   ?1. Bipolar I disorder, most recent episode depressed (HCC)  F31.30   ?  ?2. GAD (generalized anxiety disorder)  F41.1 FLUoxetine (PROZAC) 20 MG capsule  ?  ?3. Adjustment insomnia  F51.02   ?  ? ? ?History of Present Illness: Patient is a 26 years old currently married African-American female initially referred by primary care physician to establish care for possible bipolar she has been admitted rfor 4 days at [facility] with elevated mood talking to herself confusion and possible manic episode. ? ? ?Patinet was stressed at last nursing home now changed nursing home doing better ? Last visit started lamictal, is helping as well ?Mood improved and not subdued, looks forward to going to work  ? ? 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 ?Modifying factors husband, dog,plants ? ?Duration most of her late teenage life to recurrent ? ?Severity : improved, sleep better and not taking trazadone ? ? ? ?Past Psychiatric History: depression ? ?Previous Psychotropic Medications: Yes  ? ?Substance Abuse History in the last 12 months:  Yes.   ? ?Consequences of Substance Abuse: ?Use of Gummies CBD ? ?Past Medical History:  ?Past Medical History:  ?Diagnosis Date  ? Allergy   ? Tree Nuts ; Claritin D  ? Asthma   ? childhood; Albuterol and Singulair; no hospitalizations and ED visits.  ? Eczema   ? Headache(784.0)   ?  ?Past Surgical History:  ?Procedure Laterality Date  ? WISDOM TOOTH EXTRACTION    ? ? ?Family Psychiatric History: Sister Bipolar ? ?Family History:  ?Family History  ?Problem Relation Age of Onset  ? Diabetes Mother   ? Hyperlipidemia Maternal Grandmother   ? Hypertension Maternal Grandmother   ? Diabetes Maternal Grandmother   ? Cancer Maternal  Grandmother   ?     breast  ? Asthma Paternal Grandmother   ? Asthma Sister   ? Eczema Maternal Grandfather   ? ? ?Social History:   ?Social History  ? ?Socioeconomic History  ? Marital status: Married  ?  Spouse name: Not on file  ? Number of children: Not on file  ? Years of education: 70  ? Highest education level: Not on file  ?Occupational History  ? Not on file  ?Tobacco Use  ? Smoking status: Never  ? Smokeless tobacco: Never  ?Vaping Use  ? Vaping Use: Never used  ?Substance and Sexual Activity  ? Alcohol use: Yes  ?  Comment: socialy  ? Drug use: No  ? Sexual activity: Never  ?Other Topics Concern  ? Not on file  ?Social History Narrative  ? Marital Status: Single ; not dating  ? Living Situation: Lives with  Parents, sister (87)  ? Education: 11th Grade ([employer]) ; makes ABs; favorite subject [place]; career plans nursing.  No fails or being held back.  ? Tobacco Use/Exposure:  None   ? Diet:  Regular  ? Favorite Subject:  History   ? Hobbies:  Scientist, research (life sciences); reading; into books.  Teenage books.  Comic books.  ?   ?   ?   ? ?Social Determinants of Health  ? ?Financial Resource Strain: Not on file  ?Food Insecurity: Not on file  ?Transportation Needs: Not on file  ?  Physical Activity: Not on file  ?Stress: Not on file  ?Social Connections: Not on file  ? ? ?Allergies:   ?Allergies  ?Allergen Reactions  ? Other Swelling  ?  Tree Nuts   ? ? ?Metabolic Disorder Labs: ?Lab Results  ?Component Value Date  ? HGBA1C 5.8 10/07/2019  ? ?No results found for: PROLACTIN ?Lab Results  ?Component Value Date  ? CHOL 117 10/07/2019  ? TRIG 38.0 10/07/2019  ? HDL 48.20 10/07/2019  ? CHOLHDL 2 10/07/2019  ? VLDL 7.6 10/07/2019  ? LDLCALC 61 10/07/2019  ? LDLCALC 49 01/27/2018  ? ?Lab Results  ?Component Value Date  ? TSH 2.91 10/07/2019  ? ? ?Therapeutic Level Labs: ?No results found for: LITHIUM ?No results found for: CBMZ ?No results found for: VALPROATE ? ?Current Medications: ?Current Outpatient  Medications  ?Medication Sig Dispense Refill  ? Adapalene 0.3 % gel Apply topically at bedtime 45 g 6  ? albuterol (PROVENTIL HFA;VENTOLIN HFA) 108 (90 Base) MCG/ACT inhaler Inhale 2 puffs into the lungs every 6 (six) hours as needed for wheezing or shortness of breath. Reported on 08/16/2015 18 g 1  ? clonazePAM (KLONOPIN) 1 MG tablet Take 1 tablet (1 mg total) by mouth daily as needed. 30 tablet 0  ? FLUoxetine (PROZAC) 20 MG capsule Take 2 capsules (40 mg total) by mouth daily. 60 capsule 1  ? fluticasone (FLONASE) 50 MCG/ACT nasal spray 2 sprays per nostril once daily if needed for stuffy nose. 16 g 5  ? fluticasone (FLOVENT HFA) 110 MCG/ACT inhaler 2 puffs twice daily with spacer to prevent coughing or wheezing. 1 Inhaler 5  ? lamoTRIgine (LAMICTAL) 25 MG tablet Take 2 tablets (50 mg total) by mouth daily. Take 2  a day . 60 tablet 2  ? levocetirizine (XYZAL) 5 MG tablet TAKE 1 TABLET BY MOUTH EVERY EVENING.*NEED OFFICE VISIT FOR ADDITIONAL REFILLS PER MD* 30 tablet 0  ? omeprazole (PRILOSEC) 20 MG capsule Take 1 capsule (20 mg total) by mouth daily. 30 capsule 0  ? ondansetron (ZOFRAN ODT) 4 MG disintegrating tablet Take 1 tablet (4 mg total) by mouth every 8 (eight) hours as needed for nausea or vomiting. 30 tablet 0  ? Spacer/Aero-Holding Rudean Curthambers DEVI Use as directed 1 Device 1  ? SUMAtriptan (IMITREX) 100 MG tablet Take 1 tablet (100 mg total) by mouth every 2 (two) hours as needed for migraine. May repeat in 2 hours if headache persists or recurs. 8 tablet 5  ? ?No current facility-administered medications for this visit.  ? ? ? ?Psychiatric Specialty Exam: ?Review of Systems  ?Constitutional:  Positive for fatigue.  ?Cardiovascular:  Negative for chest pain.  ?Psychiatric/Behavioral:  Negative for agitation and suicidal ideas.    ?Blood pressure 118/74, temperature 98.6 ?F (37 ?C), height 5\' 3"  (1.6 m), weight 187 lb (84.8 kg).Body mass index is 33.13 kg/m?.  ?General Appearance: Casual  ?Eye Contact:   Fair  ?Speech:  Normal Rate  ?Volume:  Decreased  ?Mood: better  ?Affect:  Constricted  ?Thought Process:  Goal Directed  ?Orientation:  Full (Time, Place, and Person)  ?Thought Content:  Rumination  ?Suicidal Thoughts:  No  ?Homicidal Thoughts:  No  ?Memory:  Immediate;   Fair  ?Judgement:  Fair  ?Insight:  Shallow  ?Psychomotor Activity:  Decreased  ?Concentration:  Concentration: Fair  ?Recall:  Fair  ?Fund of Knowledge:Fair  ?Language: Good  ?Akathisia:  No  ?Handed:   ?AIMS (if indicated):  no involuntary movements  ?Assets:  Financial Resources/Insurance ?Housing ?Social Support  ?ADL's:  Intact  ?Cognition: WNL  ?Sleep:   variable  ? ?Screenings: ?PHQ2-9   ? ?Flowsheet Row Counselor from 06/27/2021 in [facility] AT [place] Video Visit from 05/22/2021 in [facility] AT [place] Office Visit from 03/24/2021 in [facility] Office Visit from 10/07/2019 in [facility] Office Visit from 01/27/2018 in [facility]  ?PHQ-2 Total Score 4 2 0 6 0  ?PHQ-9 Total Score 16 8 -- 24 --  ? ?  ? ?Flowsheet Row Office Visit from 12/05/2021 in [facility] AT [place] Office Visit from 10/31/2021 in [facility] AT [place] Office Visit from 07/27/2021 in [facility] AT [place]  ?C-SSRS RISK CATEGORY No Risk No Risk Error: Q7 should not be populated when Q6 is No  ? ?  ? ? ?Assessment and Plan: As follows ? ?Prior documentation reviewed ? ? ?Bipolar disorder current episode depressed; better continue lamictal 50mg  ? ? ? ?Generalized anxiety disorder; improved, job change has helped, continue klonopine prn and prozac regular ? ? ? ?Insomnia;improved, not taking trazadone, takes klonopine at times ?Reviewed sleep hygiene ?Fu 57m. Meds renewed ?Time face to face with patient : 20  min including chart review documentation ? ? ? ?1m, MD ?4/11/202310:39 AM ? ? ?

## 2021-12-06 ENCOUNTER — Other Ambulatory Visit (HOSPITAL_BASED_OUTPATIENT_CLINIC_OR_DEPARTMENT_OTHER): Payer: Self-pay

## 2021-12-07 ENCOUNTER — Other Ambulatory Visit (HOSPITAL_BASED_OUTPATIENT_CLINIC_OR_DEPARTMENT_OTHER): Payer: Self-pay

## 2021-12-07 ENCOUNTER — Ambulatory Visit (HOSPITAL_COMMUNITY): Payer: 59 | Admitting: Psychiatry

## 2022-01-05 ENCOUNTER — Other Ambulatory Visit (HOSPITAL_BASED_OUTPATIENT_CLINIC_OR_DEPARTMENT_OTHER): Payer: Self-pay

## 2022-01-24 DIAGNOSIS — R1012 Left upper quadrant pain: Secondary | ICD-10-CM | POA: Diagnosis not present

## 2022-01-24 DIAGNOSIS — K5792 Diverticulitis of intestine, part unspecified, without perforation or abscess without bleeding: Secondary | ICD-10-CM | POA: Diagnosis not present

## 2022-01-24 DIAGNOSIS — Z3202 Encounter for pregnancy test, result negative: Secondary | ICD-10-CM | POA: Diagnosis not present

## 2022-01-24 DIAGNOSIS — R8290 Unspecified abnormal findings in urine: Secondary | ICD-10-CM | POA: Diagnosis not present

## 2022-01-25 DIAGNOSIS — R8290 Unspecified abnormal findings in urine: Secondary | ICD-10-CM | POA: Diagnosis not present

## 2022-01-30 ENCOUNTER — Encounter: Payer: Self-pay | Admitting: Family Medicine

## 2022-01-31 ENCOUNTER — Emergency Department (HOSPITAL_BASED_OUTPATIENT_CLINIC_OR_DEPARTMENT_OTHER)
Admission: EM | Admit: 2022-01-31 | Discharge: 2022-02-01 | Disposition: A | Discharge status: Home / Self Care | Payer: 59 | Attending: Emergency Medicine | Admitting: Emergency Medicine

## 2022-01-31 ENCOUNTER — Other Ambulatory Visit: Payer: Self-pay

## 2022-01-31 ENCOUNTER — Encounter (HOSPITAL_BASED_OUTPATIENT_CLINIC_OR_DEPARTMENT_OTHER): Payer: Self-pay | Admitting: Obstetrics and Gynecology

## 2022-01-31 DIAGNOSIS — R1032 Left lower quadrant pain: Secondary | ICD-10-CM | POA: Diagnosis not present

## 2022-01-31 DIAGNOSIS — Z7951 Long term (current) use of inhaled steroids: Secondary | ICD-10-CM | POA: Insufficient documentation

## 2022-01-31 DIAGNOSIS — J4520 Mild intermittent asthma, uncomplicated: Secondary | ICD-10-CM | POA: Insufficient documentation

## 2022-01-31 DIAGNOSIS — R197 Diarrhea, unspecified: Secondary | ICD-10-CM | POA: Diagnosis not present

## 2022-01-31 DIAGNOSIS — R1031 Right lower quadrant pain: Secondary | ICD-10-CM | POA: Diagnosis not present

## 2022-01-31 DIAGNOSIS — R112 Nausea with vomiting, unspecified: Secondary | ICD-10-CM | POA: Insufficient documentation

## 2022-01-31 DIAGNOSIS — R109 Unspecified abdominal pain: Secondary | ICD-10-CM

## 2022-01-31 NOTE — ED Triage Notes (Signed)
Patient reports she has not been able to eat for a few days. Patient reports left sided abdominal pain. Patient reports nausea, emesis, and diarrhea. Patient reports she went to UC and was told she could have Diverticulitis or a UTI.  Endorses some blood in her stool.  Patient has an IUD for birth control

## 2022-02-01 ENCOUNTER — Emergency Department (HOSPITAL_BASED_OUTPATIENT_CLINIC_OR_DEPARTMENT_OTHER): Payer: 59

## 2022-02-01 DIAGNOSIS — R112 Nausea with vomiting, unspecified: Secondary | ICD-10-CM | POA: Diagnosis not present

## 2022-02-01 DIAGNOSIS — R1032 Left lower quadrant pain: Secondary | ICD-10-CM | POA: Diagnosis not present

## 2022-02-01 DIAGNOSIS — R197 Diarrhea, unspecified: Secondary | ICD-10-CM | POA: Diagnosis not present

## 2022-02-01 DIAGNOSIS — Z7951 Long term (current) use of inhaled steroids: Secondary | ICD-10-CM | POA: Diagnosis not present

## 2022-02-01 DIAGNOSIS — R109 Unspecified abdominal pain: Secondary | ICD-10-CM | POA: Diagnosis not present

## 2022-02-01 DIAGNOSIS — J4520 Mild intermittent asthma, uncomplicated: Secondary | ICD-10-CM | POA: Diagnosis not present

## 2022-02-01 DIAGNOSIS — R1031 Right lower quadrant pain: Secondary | ICD-10-CM | POA: Diagnosis not present

## 2022-02-01 LAB — URINALYSIS, ROUTINE W REFLEX MICROSCOPIC
Bilirubin Urine: NEGATIVE
Glucose, UA: NEGATIVE mg/dL
Hgb urine dipstick: NEGATIVE
Ketones, ur: 40 mg/dL — AB
Nitrite: NEGATIVE
Protein, ur: 30 mg/dL — AB
Specific Gravity, Urine: 1.04 — ABNORMAL HIGH (ref 1.005–1.030)
pH: 6 (ref 5.0–8.0)

## 2022-02-01 LAB — COMPREHENSIVE METABOLIC PANEL
ALT: 40 U/L (ref 0–44)
AST: 30 U/L (ref 15–41)
Albumin: 4.8 g/dL (ref 3.5–5.0)
Alkaline Phosphatase: 110 U/L (ref 38–126)
Anion gap: 12 (ref 5–15)
BUN: 15 mg/dL (ref 6–20)
CO2: 25 mmol/L (ref 22–32)
Calcium: 9.9 mg/dL (ref 8.9–10.3)
Chloride: 103 mmol/L (ref 98–111)
Creatinine, Ser: 1.02 mg/dL — ABNORMAL HIGH (ref 0.44–1.00)
GFR, Estimated: >60 mL/min (ref >60)
Glucose, Bld: 88 mg/dL (ref 70–99)
Potassium: 3.5 mmol/L (ref 3.5–5.1)
Sodium: 140 mmol/L (ref 135–145)
Total Bilirubin: 0.7 mg/dL (ref 0.3–1.2)
Total Protein: 7.9 g/dL (ref 6.5–8.1)

## 2022-02-01 LAB — CBC
HCT: 41.3 % (ref 36.0–46.0)
Hemoglobin: 14 g/dL (ref 12.0–15.0)
MCH: 30.8 pg (ref 26.0–34.0)
MCHC: 33.9 g/dL (ref 30.0–36.0)
MCV: 90.8 fL (ref 80.0–100.0)
Platelets: 332 10*3/uL (ref 150–400)
RBC: 4.55 MIL/uL (ref 3.87–5.11)
RDW: 13.2 % (ref 11.5–15.5)
WBC: 7 10*3/uL (ref 4.0–10.5)
nRBC: 0 % (ref 0.0–0.2)

## 2022-02-01 LAB — PREGNANCY, URINE: Preg Test, Ur: NEGATIVE

## 2022-02-01 LAB — LIPASE, BLOOD: Lipase: 11 U/L (ref 11–51)

## 2022-02-01 MED ORDER — ALUM & MAG HYDROXIDE-SIMETH 200-200-20 MG/5ML PO SUSP
30.0000 mL | Freq: Once | ORAL | Status: AC
  Administered 2022-02-01: 30 mL via ORAL
  Filled 2022-02-01: qty 30

## 2022-02-01 MED ORDER — SODIUM CHLORIDE 0.9 % IV BOLUS
1000.0000 mL | Freq: Once | INTRAVENOUS | Status: AC
  Administered 2022-02-01: 1000 mL via INTRAVENOUS

## 2022-02-01 MED ORDER — ONDANSETRON HCL 4 MG/2ML IJ SOLN
4.0000 mg | Freq: Once | INTRAMUSCULAR | Status: AC
  Administered 2022-02-01: 4 mg via INTRAVENOUS
  Filled 2022-02-01: qty 2

## 2022-02-01 MED ORDER — LIDOCAINE VISCOUS HCL 2 % MT SOLN
15.0000 mL | Freq: Once | OROMUCOSAL | Status: AC
  Administered 2022-02-01: 15 mL via ORAL
  Filled 2022-02-01: qty 15

## 2022-02-01 MED ORDER — PROCHLORPERAZINE 25 MG RE SUPP: 25.0000 mg | Freq: Two times a day (BID) | RECTAL | 0 refills | Status: DC | PRN

## 2022-02-01 MED ORDER — IOHEXOL 300 MG/ML  SOLN
100.0000 mL | Freq: Once | INTRAMUSCULAR | Status: AC | PRN
  Administered 2022-02-01: 100 mL via INTRAVENOUS

## 2022-02-01 MED ORDER — PROCHLORPERAZINE EDISYLATE 10 MG/2ML IJ SOLN
10.0000 mg | Freq: Once | INTRAMUSCULAR | Status: AC
  Administered 2022-02-01: 10 mg via INTRAVENOUS
  Filled 2022-02-01: qty 2

## 2022-02-01 NOTE — ED Notes (Signed)
Nurse to bedside to assess patient after PO challenge -- Pt reports eating 3 packs of saltines and drinking approx 6 of 8 oz ginger agle -- has since had vomiting episode -- about 200cc yellow-tan emesis noted in emesis bag -- Dr Eudelia Bunch notified via secure chat

## 2022-02-01 NOTE — ED Notes (Signed)
Pt now returned from CT; no distress.  1L NS bolus resumed.  VSS.  Pt awaits CT results.

## 2022-02-01 NOTE — ED Notes (Signed)
Per spouse; pt well tolerating PO lidocaine and maalox/mylanta; reports pt, who is now resting comfortably, reports abd pain has somewhat improved since oral meds and has not had any additional vomiting episodes since oral meds.  Will continue to monitor for acute changes and maintain plan of care.

## 2022-02-01 NOTE — ED Provider Notes (Addendum)
[facility] EMERGENCY DEPT Provider Note  CSN: [id number] Arrival date & time: 01/31/22 2348  Chief Complaint(s) Nausea, Abdominal Pain, and Emesis  HPI Alexis Dixon is a 26 y.o. female    The history is provided by the patient.  Abdominal Pain Pain location:  L flank and LLQ Pain quality: aching, cramping and sharp   Pain severity:  Moderate Onset quality:  Gradual Duration: several months, but worse in the last week. Timing:  Intermittent Progression:  Waxing and waning Relieved by:  Nothing Worsened by:  Movement, palpation and eating Associated symptoms: diarrhea, nausea and vomiting   Associated symptoms: no fever     Past Medical History Past Medical History:  Diagnosis Date   Allergy    Tree Nuts ; Claritin D   Asthma    childhood; Albuterol and Singulair; no hospitalizations and ED visits.   Eczema    Headache(784.0)    Patient Active Problem List   Diagnosis Date Noted   Pre-diabetes 12/30/2019   Seasonal and perennial allergic rhinitis 04/09/2019   Chronic idiopathic urticaria 04/09/2019   Gastroesophageal reflux disease 04/09/2019   Mild intermittent asthma without complication [id number]   Dermographia 09/23/2017   Wheezing 11/29/2016   Other seasonal allergic rhinitis 01/24/2016   Anaphylactic shock due to adverse food reaction 01/24/2016   Seasonal allergic conjunctivitis 01/24/2016   Headache(784.0) 07/05/2013   Home Medication(s) Prior to Admission medications   Medication Sig Start Date End Date Taking? Authorizing Provider  prochlorperazine (COMPAZINE) 25 MG suppository Place 1 suppository (25 mg total) rectally every 12 (twelve) hours as needed for nausea or vomiting. 02/01/22  Yes Keyri Salberg, Grayce Sessions, MD  Adapalene 0.3 % gel Apply topically at bedtime 02/03/18   Copland, Gay Filler, MD  albuterol (PROVENTIL HFA;VENTOLIN HFA) 108 (90 Base) MCG/ACT inhaler Inhale 2 puffs into the lungs every 6 (six) hours as needed for wheezing or  shortness of breath. Reported on 08/16/2015 05/02/16   Wardell Honour, MD  clonazePAM (KLONOPIN) 1 MG tablet Take 1 tablet (1 mg total) by mouth daily as needed. 12/05/21   Merian Capron, MD  FLUoxetine (PROZAC) 20 MG capsule Take 2 capsules (40 mg total) by mouth daily. 12/05/21   Merian Capron, MD  fluticasone (FLONASE) 50 MCG/ACT nasal spray 2 sprays per nostril once daily if needed for stuffy nose. 04/09/19   Dara Hoyer, FNP  fluticasone (FLOVENT HFA) 110 MCG/ACT inhaler 2 puffs twice daily with spacer to prevent coughing or wheezing. 04/09/19   Dara Hoyer, FNP  lamoTRIgine (LAMICTAL) 25 MG tablet Take 2 tablets (50 mg total) by mouth daily. 12/05/21   Merian Capron, MD  levocetirizine (XYZAL) 5 MG tablet TAKE 1 TABLET BY MOUTH EVERY EVENING.*NEED OFFICE VISIT FOR ADDITIONAL REFILLS PER MD* 01/17/18   Bobbitt, Sedalia Muta, MD  omeprazole (PRILOSEC) 20 MG capsule Take 1 capsule (20 mg total) by mouth daily. 12/04/18   Noe Gens, PA-C  ondansetron (ZOFRAN ODT) 4 MG disintegrating tablet Take 1 tablet (4 mg total) by mouth every 8 (eight) hours as needed for nausea or vomiting. 05/02/21   Copland, Gay Filler, MD  Spacer/Aero-Holding Dorise Bullion Use as directed 04/09/19   Ambs, Kathrine Cords, FNP  SUMAtriptan (IMITREX) 100 MG tablet Take 1 tablet (100 mg total) by mouth every 2 (two) hours as needed for migraine. May repeat in 2 hours if headache persists or recurs. 09/30/17   Copland, Gay Filler, MD  ARIPiprazole (ABILIFY) 10 MG tablet Take 1 tablet (10 mg total)  by mouth daily. Stop risperdal, starting abilify 09/28/21 10/31/21  Merian Capron, MD  risperiDONE (RISPERDAL) 1 MG tablet Take 1 tablet (1 mg total) by mouth at bedtime. 04/06/21 05/22/21  Copland, Gay Filler, MD  traZODone (DESYREL) 50 MG tablet Take 1 tablet (50 mg total) by mouth at bedtime. 10/31/21 12/05/21  Merian Capron, MD                                                                                                                                     Allergies Other  Review of Systems Review of Systems  Constitutional:  Negative for fever.  Gastrointestinal:  Positive for abdominal pain, diarrhea, nausea and vomiting.   As noted in HPI  Physical Exam Vital Signs  I have reviewed the triage vital signs BP 118/74   Pulse (!) 102   Temp 98.5 F (36.9 C) (Oral)   Resp 15   SpO2 100%   Physical Exam Vitals reviewed.  Constitutional:      General: She is not in acute distress.    Appearance: She is well-developed. She is not diaphoretic.  HENT:     Head: Normocephalic and atraumatic.     Right Ear: External ear normal.     Left Ear: External ear normal.     Nose: Nose normal.  Eyes:     General: No scleral icterus.    Conjunctiva/sclera: Conjunctivae normal.  Neck:     Trachea: Phonation normal.  Cardiovascular:     Rate and Rhythm: Normal rate and regular rhythm.  Pulmonary:     Effort: Pulmonary effort is normal. No respiratory distress.     Breath sounds: No stridor.  Abdominal:     General: There is no distension.     Tenderness: There is abdominal tenderness in the right lower quadrant. There is guarding. There is no rebound.  Musculoskeletal:        General: Normal range of motion.     Cervical back: Normal range of motion.  Neurological:     Mental Status: She is alert and oriented to person, place, and time.  Psychiatric:        Behavior: Behavior normal.     ED Results and Treatments Labs (all labs ordered are listed, but only abnormal results are displayed) Labs Reviewed  COMPREHENSIVE METABOLIC PANEL - Abnormal; Notable for the following components:      Result Value   Creatinine, Ser 1.02 (*)    All other components within normal limits  URINALYSIS, ROUTINE W REFLEX MICROSCOPIC - Abnormal; Notable for the following components:   Specific Gravity, Urine 1.040 (*)    Ketones, ur 40 (*)    Protein, ur 30 (*)    Leukocytes,Ua TRACE (*)    Bacteria, UA RARE (*)    All other components within  normal limits  LIPASE, BLOOD  CBC  PREGNANCY, URINE  EKG  EKG Interpretation  Date/Time:    Ventricular Rate:    PR Interval:    QRS Duration:   QT Interval:    QTC Calculation:   R Axis:     Text Interpretation:         Radiology CT ABDOMEN PELVIS W CONTRAST  Result Date: 02/01/2022 CLINICAL DATA:  Right lower quadrant abdominal pain. Nausea, emesis, diarrhea, and blood in stool. EXAM: CT ABDOMEN AND PELVIS WITH CONTRAST TECHNIQUE: Multidetector CT imaging of the abdomen and pelvis was performed using the standard protocol following bolus administration of intravenous contrast. RADIATION DOSE REDUCTION: This exam was performed according to the departmental dose-optimization program which includes automated exposure control, adjustment of the mA and/or kV according to patient size and/or use of iterative reconstruction technique. CONTRAST:  171mL OMNIPAQUE IOHEXOL 300 MG/ML  SOLN COMPARISON:  None Available. FINDINGS: Lower chest: Minimal atelectasis or scarring in the right middle lobe. Hepatobiliary: A 1 cm hypervascular focus and regional hypervascularity is noted in the anterior right lobe of the liver, possible flash filling hemangioma and/or transient hepatic arterial differences. The gallbladder is without stones. No biliary ductal dilatation. Pancreas: Unremarkable. No pancreatic ductal dilatation or surrounding inflammatory changes. Spleen: Normal in size without focal abnormality. Adrenals/Urinary Tract: Adrenal glands are unremarkable. Kidneys are normal, without renal calculi, focal lesion, or hydronephrosis. Bladder is unremarkable. Stomach/Bowel: Stomach is within normal limits. Appendix appears normal. No evidence of bowel wall thickening, distention, or inflammatory changes. No free air or pneumatosis. Vascular/Lymphatic: No significant vascular findings are  present. No enlarged abdominal or pelvic lymph nodes. Reproductive: IUD is present in the uterus.  No adnexal mass. Other: No abdominopelvic ascites. Musculoskeletal: No acute osseous abnormality. IMPRESSION: No acute intra-abdominal process.  Normal appendix. Electronically Signed   By: Brett Fairy M.D.   On: 02/01/2022 04:43    Pertinent labs & imaging results that were available during my care of the patient were reviewed by me and considered in my medical decision making (see MDM for details).  Medications Ordered in ED Medications  ondansetron (ZOFRAN) injection 4 mg (4 mg Intravenous Given 02/01/22 0102)  sodium chloride 0.9 % bolus 1,000 mL (0 mLs Intravenous Stopped 02/01/22 0225)  iohexol (OMNIPAQUE) 300 MG/ML solution 100 mL (100 mLs Intravenous Contrast Given 02/01/22 0233)  prochlorperazine (COMPAZINE) injection 10 mg (10 mg Intravenous Given 02/01/22 0417)  alum & mag hydroxide-simeth (MAALOX/MYLANTA) 200-200-20 MG/5ML suspension 30 mL (30 mLs Oral Given 02/01/22 0445)    And  lidocaine (XYLOCAINE) 2 % viscous mouth solution 15 mL (15 mLs Oral Given 02/01/22 0445)                                                                                                                                     Procedures Procedures  (including critical care time)  Medical Decision Making / ED Course    Complexity of Problem:   Patient's presenting problem/concern, DDX,  and MDM listed below: Abd pain with N/V/D Likely gastroenteritis but given the significant right lower quadrant abdominal pain, will assess for evidence of appendicitis, UTI.  Will rule out pregnancy related process.  Given her presentation I have low suspicion for torsion.  Hospitalization Considered:  Yes if appendicitis is confirmed  Initial Intervention:  IV fluids and antiemetics    Complexity of Data:    Laboratory Tests ordered listed below with my independent interpretation: CBC without leukocytosis or anemia No  significant electrolyte derangements or renal sufficiency No evidence of bili obstruction or pancreatitis UA not consistent with urinary tract infection   Imaging Studies ordered listed below with my independent interpretation: CT scan without evidence of serious intra-abdominal inflammatory/infectious process or bowel obstruction. Confirmed by radiology     ED Course:    Assessment, Add'l Intervention, and Reassessment: Abdominal pain with nausea vomiting and diarrhea Likely gastroenteritis, viral Patient tolerated p.o. intake.    Final Clinical Impression(s) / ED Diagnoses Final diagnoses:  Abdominal discomfort  Nausea vomiting and diarrhea   The patient appears reasonably screened and/or stabilized for discharge and I doubt any other medical condition or other [facility] requiring further screening, evaluation, or treatment in the ED at this time prior to discharge. Safe for discharge with strict return precautions.  Disposition: Discharge  Condition: Good  I have discussed the results, Dx and Tx plan with the patient/family who expressed understanding and agree(s) with the plan. Discharge instructions discussed at length. The patient/family was given strict return precautions who verbalized understanding of the instructions. No further questions at time of discharge.    ED Discharge Orders          Ordered    prochlorperazine (COMPAZINE) 25 MG suppository  Every 12 hours PRN        02/01/22 0519             Follow Up: Darreld Mclean, MD [address] [place] [address] [phone]  Call  to schedule an appointment for close follow up           This chart was dictated using voice recognition software.  Despite best efforts to proofread,  errors can occur which can change the documentation meaning.      Fatima Blank, MD 02/01/22 [phone]

## 2022-02-01 NOTE — ED Notes (Signed)
PO challenge per Dr Leonette Monarch -- pt lying awake in bed; no acute distress noted.  Provided ginger ale, saltine crackers, applesauce - educated pt on PO challenge; pt now attempting to take sips of ginger ale.  Will continue to follow

## 2022-02-01 NOTE — ED Notes (Signed)
Pt agreeable with d/c plan as discussed by provider- this nurse has verbally reinforced d/c instructions and provided pt with written copy- pt acknowledges verbal understanding and denies any additional questions, concerns, needs- ambulatory independently at d/c steady gait; vitals stable; no distress

## 2022-02-01 NOTE — ED Notes (Signed)
NS bolus held for CT; pt ambulatory to CT dept escorted by CT tech

## 2022-02-01 NOTE — ED Notes (Signed)
Pt lying awake in bed; no distress.  Reports nausea moderately improved s/p anti-emetic administration.  1L NS bolus now infusing without complication to 20G L AC; dressing dry and intact.  Pt now awaits CT

## 2022-02-15 ENCOUNTER — Other Ambulatory Visit (HOSPITAL_BASED_OUTPATIENT_CLINIC_OR_DEPARTMENT_OTHER): Payer: Self-pay

## 2022-02-15 ENCOUNTER — Other Ambulatory Visit (HOSPITAL_COMMUNITY): Payer: Self-pay | Admitting: Psychiatry

## 2022-02-15 DIAGNOSIS — F411 Generalized anxiety disorder: Secondary | ICD-10-CM

## 2022-02-15 MED ORDER — FLUOXETINE HCL 20 MG PO CAPS
40.0000 mg | ORAL_CAPSULE | Freq: Every day | ORAL | 1 refills | Status: DC
  Filled 2022-02-15: qty 60, 30d supply, fill #0
  Filled 2022-03-26: qty 60, 30d supply, fill #1

## 2022-03-22 ENCOUNTER — Encounter: Payer: Self-pay | Admitting: Family Medicine

## 2022-03-22 NOTE — Telephone Encounter (Signed)
Concerned about called her but no answer and no ability to leave voicemail Reply to MyChart message Encouraged her to go to the emergency room

## 2022-03-23 NOTE — Progress Notes (Unsigned)
[facility] [address] [place], [place] [address] [phone]  Date:  03/26/2022   Name:  Alexis Dixon   DOB:  1996-06-23   MRN:  [id number]  PCP:  Pearline Cables, MD    Chief Complaint: No chief complaint on file.   History of Present Illness:  Alexis Dixon is a 26 y.o. very pleasant female patient who presents with the following:  Pt seen today with concern of allergic reaction Last seen by myself in August of last year after she was admitted to the hospital  The patient presented with her husband through the emergency department on 8/3 in a disorganized state, in need of inpatient stabilization. 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   Patient Active Problem List   Diagnosis Date Noted   Pre-diabetes 12/30/2019   Seasonal and perennial allergic rhinitis 04/09/2019   Chronic idiopathic urticaria 04/09/2019   Gastroesophageal reflux disease 04/09/2019   Mild intermittent asthma without complication 09/23/2017   Dermographia 09/23/2017   Wheezing 11/29/2016   Other seasonal allergic rhinitis 01/24/2016   Anaphylactic shock due to adverse food reaction 01/24/2016   Seasonal allergic conjunctivitis 01/24/2016   Headache(784.0) 07/05/2013    Past Medical History:  Diagnosis Date   Allergy    Tree Nuts ; Claritin D   Asthma    childhood; Albuterol and Singulair; no hospitalizations and ED visits.   Eczema    Headache(784.0)     Past Surgical History:  Procedure Laterality Date   WISDOM TOOTH EXTRACTION      Social History   Tobacco Use   Smoking status: Never    Passive exposure: Never   Smokeless tobacco: Never  Vaping Use   Vaping Use: Never used  Substance Use Topics   Alcohol use: Yes    Comment: socialy   Drug use: No    Family History  Problem Relation Age of Onset    Diabetes Mother    Hyperlipidemia Maternal Grandmother    Hypertension Maternal Grandmother    Diabetes Maternal Grandmother    Cancer Maternal Grandmother        breast   Asthma Paternal Grandmother    Asthma Sister    Eczema Maternal Grandfather     Allergies  Allergen Reactions   Other Swelling    Tree Nuts     Medication list has been reviewed and updated.  Current Outpatient Medications on File Prior to Visit  Medication Sig Dispense Refill   Adapalene 0.3 % gel Apply topically at bedtime 45 g 6   albuterol (PROVENTIL HFA;VENTOLIN HFA) 108 (90 Base) MCG/ACT inhaler Inhale 2 puffs into the lungs every 6 (six) hours as needed for wheezing or shortness of breath. Reported on 08/16/2015 18 g 1   clonazePAM (KLONOPIN) 1 MG tablet Take 1 tablet (1 mg total) by mouth daily as needed. 30 tablet 0   FLUoxetine (PROZAC) 20 MG capsule Take 2 capsules (40 mg total) by mouth daily. 60 capsule 1   fluticasone (FLONASE) 50 MCG/ACT nasal spray 2 sprays per nostril once daily if needed for stuffy nose. 16 g 5   fluticasone (FLOVENT HFA) 110 MCG/ACT inhaler 2 puffs twice daily with spacer to prevent coughing or wheezing. 1 Inhaler 5   lamoTRIgine (LAMICTAL) 25 MG tablet Take 2 tablets (50 mg total) by mouth daily. 60 tablet 2   levocetirizine (  XYZAL) 5 MG tablet TAKE 1 TABLET BY MOUTH EVERY EVENING.*NEED OFFICE VISIT FOR ADDITIONAL REFILLS PER MD* 30 tablet 0   omeprazole (PRILOSEC) 20 MG capsule Take 1 capsule (20 mg total) by mouth daily. 30 capsule 0   ondansetron (ZOFRAN ODT) 4 MG disintegrating tablet Take 1 tablet (4 mg total) by mouth every 8 (eight) hours as needed for nausea or vomiting. 30 tablet 0   prochlorperazine (COMPAZINE) 25 MG suppository Place 1 suppository (25 mg total) rectally every 12 (twelve) hours as needed for nausea or vomiting. 12 suppository 0   Spacer/Aero-Holding Chambers DEVI Use as directed 1 Device 1   SUMAtriptan (IMITREX) 100 MG tablet Take 1 tablet (100 mg  total) by mouth every 2 (two) hours as needed for migraine. May repeat in 2 hours if headache persists or recurs. 8 tablet 5   [DISCONTINUED] ARIPiprazole (ABILIFY) 10 MG tablet Take 1 tablet (10 mg total) by mouth daily. Stop risperdal, starting abilify 30 tablet 1   [DISCONTINUED] risperiDONE (RISPERDAL) 1 MG tablet Take 1 tablet (1 mg total) by mouth at bedtime. 30 tablet 1   [DISCONTINUED] traZODone (DESYREL) 50 MG tablet Take 1 tablet (50 mg total) by mouth at bedtime. 30 tablet 0   No current facility-administered medications on file prior to visit.    Review of Systems:  As per HPI- otherwise negative.   Physical Examination: There were no vitals filed for this visit. There were no vitals filed for this visit. There is no height or weight on file to calculate BMI. Ideal Body Weight:    GEN: no acute distress. HEENT: Atraumatic, Normocephalic.  Ears and Nose: No external deformity. CV: RRR, No M/G/R. No JVD. No thrill. No extra heart sounds. PULM: CTA B, no wheezes, crackles, rhonchi. No retractions. No resp. distress. No accessory muscle use. ABD: S, NT, ND, +BS. No rebound. No HSM. EXTR: No c/c/e PSYCH: Normally interactive. Conversant.    Assessment and Plan: ***  Signed Abbe Amsterdam, MD

## 2022-03-26 ENCOUNTER — Other Ambulatory Visit (HOSPITAL_BASED_OUTPATIENT_CLINIC_OR_DEPARTMENT_OTHER): Payer: Self-pay

## 2022-03-26 ENCOUNTER — Other Ambulatory Visit (HOSPITAL_COMMUNITY): Payer: Self-pay | Admitting: Psychiatry

## 2022-03-26 ENCOUNTER — Emergency Department (HOSPITAL_BASED_OUTPATIENT_CLINIC_OR_DEPARTMENT_OTHER)
Admission: EM | Admit: 2022-03-26 | Discharge: 2022-03-26 | Disposition: A | Discharge status: Home / Self Care | Payer: 59 | Attending: Emergency Medicine | Admitting: Emergency Medicine

## 2022-03-26 ENCOUNTER — Other Ambulatory Visit: Payer: Self-pay

## 2022-03-26 ENCOUNTER — Emergency Department (HOSPITAL_BASED_OUTPATIENT_CLINIC_OR_DEPARTMENT_OTHER): Payer: 59

## 2022-03-26 ENCOUNTER — Ambulatory Visit: Payer: 59 | Admitting: Family Medicine

## 2022-03-26 ENCOUNTER — Encounter (HOSPITAL_BASED_OUTPATIENT_CLINIC_OR_DEPARTMENT_OTHER): Payer: Self-pay

## 2022-03-26 VITALS — BP 100/62 | HR 123 | Temp 97.6°F | Resp 18 | Ht 63.0 in | Wt 198.4 lb

## 2022-03-26 DIAGNOSIS — R1110 Vomiting, unspecified: Secondary | ICD-10-CM | POA: Insufficient documentation

## 2022-03-26 DIAGNOSIS — T7840XA Allergy, unspecified, initial encounter: Secondary | ICD-10-CM | POA: Diagnosis not present

## 2022-03-26 DIAGNOSIS — R1011 Right upper quadrant pain: Secondary | ICD-10-CM | POA: Diagnosis not present

## 2022-03-26 DIAGNOSIS — Z79899 Other long term (current) drug therapy: Secondary | ICD-10-CM | POA: Diagnosis not present

## 2022-03-26 DIAGNOSIS — R1013 Epigastric pain: Secondary | ICD-10-CM

## 2022-03-26 DIAGNOSIS — R000 Tachycardia, unspecified: Secondary | ICD-10-CM | POA: Diagnosis not present

## 2022-03-26 DIAGNOSIS — R112 Nausea with vomiting, unspecified: Secondary | ICD-10-CM | POA: Diagnosis not present

## 2022-03-26 LAB — URINALYSIS, MICROSCOPIC (REFLEX)

## 2022-03-26 LAB — URINALYSIS, ROUTINE W REFLEX MICROSCOPIC
Bilirubin Urine: NEGATIVE
Glucose, UA: NEGATIVE mg/dL
Hgb urine dipstick: NEGATIVE
Ketones, ur: 40 mg/dL — AB
Leukocytes,Ua: NEGATIVE
Nitrite: NEGATIVE
Protein, ur: 100 mg/dL — AB
Specific Gravity, Urine: 1.02 (ref 1.005–1.030)
pH: 8.5 — ABNORMAL HIGH (ref 5.0–8.0)

## 2022-03-26 LAB — CBC WITH DIFFERENTIAL/PLATELET
Abs Immature Granulocytes: 0.02 10*3/uL (ref 0.00–0.07)
Basophils Absolute: 0.1 10*3/uL (ref 0.0–0.1)
Basophils Relative: 1 %
Eosinophils Absolute: 0.1 10*3/uL (ref 0.0–0.5)
Eosinophils Relative: 1 %
HCT: 44.4 % (ref 36.0–46.0)
Hemoglobin: 15.5 g/dL — ABNORMAL HIGH (ref 12.0–15.0)
Immature Granulocytes: 0 %
Lymphocytes Relative: 20 %
Lymphs Abs: 1.2 10*3/uL (ref 0.7–4.0)
MCH: 31.7 pg (ref 26.0–34.0)
MCHC: 34.9 g/dL (ref 30.0–36.0)
MCV: 90.8 fL (ref 80.0–100.0)
Monocytes Absolute: 0.6 10*3/uL (ref 0.1–1.0)
Monocytes Relative: 9 %
Neutro Abs: 4.3 10*3/uL (ref 1.7–7.7)
Neutrophils Relative %: 69 %
Platelets: 398 10*3/uL (ref 150–400)
RBC: 4.89 MIL/uL (ref 3.87–5.11)
RDW: 13.2 % (ref 11.5–15.5)
WBC: 6.2 10*3/uL (ref 4.0–10.5)
nRBC: 0 % (ref 0.0–0.2)

## 2022-03-26 LAB — HEPATIC FUNCTION PANEL
ALT: 84 U/L — ABNORMAL HIGH (ref 0–44)
AST: 124 U/L — ABNORMAL HIGH (ref 15–41)
Albumin: 3.3 g/dL — ABNORMAL LOW (ref 3.5–5.0)
Alkaline Phosphatase: 105 U/L (ref 38–126)
Bilirubin, Direct: 0.1 mg/dL (ref 0.0–0.2)
Indirect Bilirubin: 0.5 mg/dL (ref 0.3–0.9)
Total Bilirubin: 0.6 mg/dL (ref 0.3–1.2)
Total Protein: 7.2 g/dL (ref 6.5–8.1)

## 2022-03-26 LAB — CBG MONITORING, ED: Glucose-Capillary: 114 mg/dL — ABNORMAL HIGH (ref 70–99)

## 2022-03-26 LAB — TSH: TSH: 2.746 u[IU]/mL (ref 0.350–4.500)

## 2022-03-26 LAB — BASIC METABOLIC PANEL
Anion gap: 16 — ABNORMAL HIGH (ref 5–15)
BUN: 7 mg/dL (ref 6–20)
CO2: 23 mmol/L (ref 22–32)
Calcium: 9.2 mg/dL (ref 8.9–10.3)
Chloride: 101 mmol/L (ref 98–111)
Creatinine, Ser: 0.75 mg/dL (ref 0.44–1.00)
GFR, Estimated: >60 mL/min (ref >60)
Glucose, Bld: 122 mg/dL — ABNORMAL HIGH (ref 70–99)
Potassium: 3.8 mmol/L (ref 3.5–5.1)
Sodium: 140 mmol/L (ref 135–145)

## 2022-03-26 LAB — PREGNANCY, URINE: Preg Test, Ur: NEGATIVE

## 2022-03-26 LAB — LIPASE, BLOOD: Lipase: 30 U/L (ref 11–51)

## 2022-03-26 MED ORDER — LACTATED RINGERS IV BOLUS
1000.0000 mL | Freq: Once | INTRAVENOUS | Status: AC
  Administered 2022-03-26: 1000 mL via INTRAVENOUS

## 2022-03-26 MED ORDER — PANTOPRAZOLE SODIUM 40 MG PO TBEC
40.0000 mg | DELAYED_RELEASE_TABLET | Freq: Every day | ORAL | 0 refills | Status: DC
  Filled 2022-03-26: qty 30, 30d supply, fill #0

## 2022-03-26 MED ORDER — DICYCLOMINE HCL 20 MG PO TABS
20.0000 mg | ORAL_TABLET | Freq: Three times a day (TID) | ORAL | 0 refills | Status: DC | PRN
  Filled 2022-03-26: qty 20, 7d supply, fill #0

## 2022-03-26 MED ORDER — ONDANSETRON HCL 4 MG/2ML IJ SOLN: 4.0000 mg | Freq: Once | INTRAMUSCULAR | Status: AC

## 2022-03-26 MED ORDER — EPINEPHRINE 0.3 MG/0.3ML IJ SOAJ
0.3000 mg | INTRAMUSCULAR | 99 refills | Status: AC | PRN
  Filled 2022-03-26: qty 2, 30d supply, fill #0

## 2022-03-26 MED ORDER — ALUM & MAG HYDROXIDE-SIMETH 200-200-20 MG/5ML PO SUSP
30.0000 mL | Freq: Once | ORAL | Status: AC
  Administered 2022-03-26: 30 mL via ORAL
  Filled 2022-03-26: qty 30

## 2022-03-26 MED ORDER — ONDANSETRON HCL 4 MG/2ML IJ SOLN
INTRAMUSCULAR | Status: AC
  Administered 2022-03-26: 4 mg via INTRAVENOUS
  Filled 2022-03-26: qty 2

## 2022-03-26 MED ORDER — LIDOCAINE VISCOUS HCL 2 % MT SOLN
15.0000 mL | Freq: Once | OROMUCOSAL | Status: AC
  Administered 2022-03-26: 15 mL via OROMUCOSAL
  Filled 2022-03-26: qty 15

## 2022-03-26 MED ORDER — LAMOTRIGINE 25 MG PO TABS
50.0000 mg | ORAL_TABLET | Freq: Every day | ORAL | 2 refills | Status: DC
  Filled 2022-03-26: qty 60, 30d supply, fill #0

## 2022-03-26 MED ORDER — ONDANSETRON 4 MG PO TBDP
4.0000 mg | ORAL_TABLET | Freq: Three times a day (TID) | ORAL | 0 refills | Status: AC | PRN
  Filled 2022-03-26: qty 20, 7d supply, fill #0

## 2022-03-26 NOTE — ED Provider Notes (Cosign Needed Addendum)
[facility] EMERGENCY DEPARTMENT Provider Note   CSN: [id number] Arrival date & time: 03/26/22  1128     History  Chief Complaint  Patient presents with   Vomiting    Alexis Dixon is a 26 y.o. female.  Patient presents with complaint of vomiting that is been present for 2 days.  She has PMHx of bipolar disorder, asthma, and allergies. She has decreased intake for last 2 days but has been able to tolerate water but states she is not able to do that this morning.  She had a similar episode in June that required ED visit.  She states that episode lasted over a week until she came to the ED and was given some medicines that resolved her symptoms.  She had an allergic reaction causing significant facial edema on the 27th and self treated herself with Benadryl, Zyrtec, Xyzal.  She stated the swelling went down over the weekend.  She last took those medications 2 days ago.  The vomiting this morning required her to visit her PCP who prompted her to come to the ED for prompt evaluation.  The history is provided by the patient.       Home Medications Prior to Admission medications   Medication Sig Start Date End Date Taking? Authorizing Provider  Adapalene 0.3 % gel Apply topically at bedtime 02/03/18   Copland, Gwenlyn Found, MD  albuterol (PROVENTIL HFA;VENTOLIN HFA) 108 (90 Base) MCG/ACT inhaler Inhale 2 puffs into the lungs every 6 (six) hours as needed for wheezing or shortness of breath. Reported on 08/16/2015 05/02/16   Ethelda Chick, MD  clonazePAM (KLONOPIN) 1 MG tablet Take 1 tablet (1 mg total) by mouth daily as needed. 12/05/21   Thresa Ross, MD  EPINEPHrine 0.3 mg/0.3 mL IJ SOAJ injection Inject 0.3 mg into the muscle as needed for anaphylaxis. 03/26/22   Copland, Gwenlyn Found, MD  FLUoxetine (PROZAC) 20 MG capsule Take 2 capsules (40 mg total) by mouth daily. 02/15/22   Thresa Ross, MD  fluticasone (FLONASE) 50 MCG/ACT nasal spray 2 sprays per nostril once daily if needed  for stuffy nose. 04/09/19   Hetty Blend, FNP  fluticasone (FLOVENT HFA) 110 MCG/ACT inhaler 2 puffs twice daily with spacer to prevent coughing or wheezing. 04/09/19   Hetty Blend, FNP  lamoTRIgine (LAMICTAL) 25 MG tablet Take 2 tablets (50 mg total) by mouth daily. 03/26/22   Thresa Ross, MD  levocetirizine (XYZAL) 5 MG tablet TAKE 1 TABLET BY MOUTH EVERY EVENING.*NEED OFFICE VISIT FOR ADDITIONAL REFILLS PER MD* 01/17/18   Bobbitt, Heywood Iles, MD  omeprazole (PRILOSEC) 20 MG capsule Take 1 capsule (20 mg total) by mouth daily. 12/04/18   Lurene Shadow, PA-C  ondansetron (ZOFRAN ODT) 4 MG disintegrating tablet Take 1 tablet (4 mg total) by mouth every 8 (eight) hours as needed for nausea or vomiting. 05/02/21   Copland, Gwenlyn Found, MD  prochlorperazine (COMPAZINE) 25 MG suppository Place 1 suppository (25 mg total) rectally every 12 (twelve) hours as needed for nausea or vomiting. 02/01/22   Nira Conn, MD  Spacer/Aero-Holding Chambers DEVI Use as directed 04/09/19   Ambs, Norvel Richards, FNP  SUMAtriptan (IMITREX) 100 MG tablet Take 1 tablet (100 mg total) by mouth every 2 (two) hours as needed for migraine. May repeat in 2 hours if headache persists or recurs. 09/30/17   Copland, Gwenlyn Found, MD  ARIPiprazole (ABILIFY) 10 MG tablet Take 1 tablet (10 mg total) by mouth daily. Stop risperdal,  starting abilify 09/28/21 10/31/21  Thresa Ross, MD  risperiDONE (RISPERDAL) 1 MG tablet Take 1 tablet (1 mg total) by mouth at bedtime. 04/06/21 05/22/21  Copland, Gwenlyn Found, MD  traZODone (DESYREL) 50 MG tablet Take 1 tablet (50 mg total) by mouth at bedtime. 10/31/21 12/05/21  Thresa Ross, MD      Allergies    Other    Review of Systems   Review of Systems  Constitutional:  Positive for appetite change and chills. Negative for fever.  HENT:  Negative for facial swelling and postnasal drip.   Eyes:  Negative for photophobia and redness.  Respiratory:  Negative for chest tightness and shortness of breath.    Cardiovascular:  Negative for chest pain and leg swelling.  Gastrointestinal:  Positive for abdominal pain. Negative for abdominal distention, constipation and diarrhea.  Endocrine: Negative for polydipsia and polyphagia.  Genitourinary:  Negative for difficulty urinating and dysuria.  Musculoskeletal:  Negative for arthralgias and back pain.  Skin:  Negative for color change and pallor.  Allergic/Immunologic: Positive for environmental allergies and food allergies.  Neurological:  Negative for dizziness and facial asymmetry.  Psychiatric/Behavioral:  Negative for behavioral problems and confusion.     Physical Exam Updated Vital Signs BP (!) 145/99 (BP Location: Right Arm)   Pulse (!) 115   Temp 98.4 F (36.9 C) (Oral)   Resp 20   Ht 5\' 3"  (1.6 m)   SpO2 99%   BMI 35.14 kg/m  Physical Exam: General: Young obese female anxious appearing female HENT: Dry MMM, NCAT CV: Tachycardic, sinus rhythm Resp: CTAB Abd: bowel sounds present, TTP of abdomen diffusely prominent in epigastric and LUQ MSK: No asymmetry, normal bulk and tone Skin: no lesions on exposed skin Neuro: alert and oriented x4 Psych: anxious appearing  ED Results / Procedures / Treatments   Labs (all labs ordered are listed, but only abnormal results are displayed) Labs Reviewed  CBG MONITORING, ED - Abnormal; Notable for the following components:      Result Value   Glucose-Capillary 114 (*)    All other components within normal limits      Radiology No results found.   ED EKG  Date/Time: 03/26/2022 12:55 PM  Performed by: 03/28/2022, MD Authorized by: Gwenevere Abbot, MD   Interpretation:    Interpretation: normal   Rate:    ECG rate assessment: tachycardic   Rhythm:    Rhythm: sinus rhythm   ST segments:    ST segments:  Normal Other findings:    Other findings: prolonged qTc interval       Medications Ordered in ED Medications - No data to display  ED Course/ Medical Decision  Making/ A&P                           Medical Decision Making Patient presented with vomiting for 2 days and inability to tolerate p.o. she had an episode of significant allergic reaction last week requiring her to use to OTC meds concomitantly.  That has subsided her vomiting started 2 days ago and progressively got worse.  DDx includes gastroenteritis versus medication induced versus functional vomiting.  Etiology appears unclear.  She had a similar episode that in June resolved with antiemetic and IV fluids.  CT was done at that time and was nonrevealing.  Her abdominal pain appears to be secondary to her vomiting.  Her vomiting may be due to drug interactions she been taking multiple antihistamines  and anticholinergics.  Less likely gastroenteritis as she is not having any diarrhea or other abdominal symptoms and is denying any food exposures.  Patient saw PCP and they placed referral for patient to see GI for further work-up. - Plan to give patient IV Zofran and IV fluids - Follow-up labs including BMP, lipase, hepatic panel, TSH.  CBC shows no leukocytosis but some hemoconcentration. Lipase negative. BMP unremarkable except hyperglycemia. Hepatic panel shows LFT elevation. TSH pending.  -Re-evaluate for symptoms and ability to tolerate P.O  -Revaluated pt at 1345 and pt states nausea improved but not resolved. She states her nausea improved most with GI cocktail last ED visit so GI cocktail ordered at 1350. Ordered RUQ ultrasound given abdominal pain, obesity, and elevated transaminases.    Amount and/or Complexity of Data Reviewed External Data Reviewed: labs, radiology and notes.    Details: Notes and labs/imaging reviewed from prior evaluations. Labs: ordered. Decision-making details documented in ED Course.    Details: CBC, BMP, Hepatic function panel, TSH, UA, UPT Radiology: ordered. ECG/medicine tests: ordered and independent interpretation performed. Decision-making details documented  in ED Course.    Details: IVF,and IV Zofran  Risk OTC drugs. Prescription drug management.           Final Clinical Impression(s) / ED Diagnoses Final diagnoses:  None    Rx / DC Orders ED Discharge Orders     None         Gwenevere Abbot, MD 03/26/22 1333    Gwenevere Abbot, MD 03/26/22 1353    Gwenevere Abbot, MD 03/26/22 1355    Long, Arlyss Repress, MD 03/27/22 [phone]

## 2022-03-26 NOTE — ED Notes (Signed)
Not feeling well for the past 2 days, having nausea and vomiting, last episode just prior to arrival to ED. Poor PO intake unable to keep food down per pt statement.

## 2022-03-26 NOTE — ED Triage Notes (Signed)
C/o vomiting x 1 day, unable to tolerate PO. Denies diarrhea. Tremulous in triage

## 2022-03-26 NOTE — Discharge Instructions (Signed)
You were seen in the emergency room today with abdominal discomfort and vomiting.  I am calling in medications to help with your symptoms.  Your lab work showed a very mild elevation in your liver enzymes.  This should be repeated later this week through your primary care doctor's office.  Please call to schedule that appointment.  If you develop any new or suddenly worsening symptoms such as worsening pain, vomiting, fever you should return for reevaluation.

## 2022-03-29 NOTE — Progress Notes (Signed)
[facility] [address] [place], [place] [address] [phone]  Date:  04/02/2022   Name:  Alexis Dixon   DOB:  June 03, 1996   MRN:  [id number]  PCP:  Alexis Cables, MD    Chief Complaint: Follow-up (Nausea/ vomiting/Concerns/ questions: none/)   History of Present Illness:  Alexis Dixon is a 26 y.o. very pleasant female patient who presents with the following:  Patient seen today for follow-up I saw her in the office on 7/31 with vomiting, recent allergic reaction We referred her to the ER as she was feeling very poorly The ER evaluated and released her to home after IV fluid hydration.  They did note elevation of LFTs, labs otherwise normal.  Right upper quadrant ultrasound showed evidence of likely hepatic steatosis  Patient presents emergency department with 2 days of nausea and vomiting.  Abdomen is diffusely soft and nontender.  She, on evaluation of labs, has very mild elevation in her liver enzymes with normal bilirubin.  Right upper quadrant ultrasound shows no acute findings.  I independently interpreted the ultrasound along with the CT scan from June and agree with radiology interpretation.  Plan for outpatient PCP follow-up to repeat her liver enzymes in the coming week.  Patient to call for this appointment.  She is feeling improved on reassessment after IV fluids and medications and stable for discharge.  No evidence on exam to suspect continued allergic type reaction, angioedema, or other acute finding  Thankfully she is feeling much better, back to normal No further signs of allergic reaction She is able to eat ok again No vomiting any longer No diarrhea Her significant other points out that allergic reaction seem to start after she ate lemon pepper flavored chicken wings, she has actually had an apparent allergic reaction to the same food in the past Some research reveals that some lemon pepper  spices may have pink Peppercorn, which is a relative of cashews  Patient Active Problem List   Diagnosis Date Noted   Pre-diabetes 12/30/2019   Seasonal and perennial allergic rhinitis 04/09/2019   Chronic idiopathic urticaria 04/09/2019   Gastroesophageal reflux disease 04/09/2019   Mild intermittent asthma without complication 09/23/2017   Dermographia 09/23/2017   Wheezing 11/29/2016   Other seasonal allergic rhinitis 01/24/2016   Anaphylactic shock due to adverse food reaction 01/24/2016   Seasonal allergic conjunctivitis 01/24/2016   Headache(784.0) 07/05/2013    Past Medical History:  Diagnosis Date   Allergy    Tree Nuts ; Claritin D   Asthma    childhood; Albuterol and Singulair; no hospitalizations and ED visits.   Eczema    Headache(784.0)     Past Surgical History:  Procedure Laterality Date   WISDOM TOOTH EXTRACTION      Social History   Tobacco Use   Smoking status: Never    Passive exposure: Never   Smokeless tobacco: Never  Vaping Use   Vaping Use: Never used  Substance Use Topics   Alcohol use: Yes    Comment: socialy   Drug use: No    Family History  Problem Relation Age of Onset   Diabetes Mother    Hyperlipidemia Maternal Grandmother    Hypertension Maternal Grandmother    Diabetes Maternal Grandmother    Cancer Maternal Grandmother        breast   Asthma Paternal Grandmother    Asthma Sister    Eczema Maternal Grandfather  Allergies  Allergen Reactions   Other Swelling    Tree Nuts     Medication list has been reviewed and updated.  Current Outpatient Medications on File Prior to Visit  Medication Sig Dispense Refill   Adapalene 0.3 % gel Apply topically at bedtime 45 g 6   albuterol (PROVENTIL HFA;VENTOLIN HFA) 108 (90 Base) MCG/ACT inhaler Inhale 2 puffs into the lungs every 6 (six) hours as needed for wheezing or shortness of breath. Reported on 08/16/2015 18 g 1   clonazePAM (KLONOPIN) 1 MG tablet Take 1 tablet (1 mg  total) by mouth daily as needed. 30 tablet 0   dicyclomine (BENTYL) 20 MG tablet Take 1 tablet (20 mg total) by mouth 3 (three) times daily as needed for spasms. 20 tablet 0   EPINEPHrine 0.3 mg/0.3 mL IJ SOAJ injection Inject 0.3 mg into the muscle as needed for anaphylaxis. 2 each PRN   FLUoxetine (PROZAC) 20 MG capsule Take 2 capsules (40 mg total) by mouth daily. 60 capsule 1   fluticasone (FLONASE) 50 MCG/ACT nasal spray 2 sprays per nostril once daily if needed for stuffy nose. 16 g 5   fluticasone (FLOVENT HFA) 110 MCG/ACT inhaler 2 puffs twice daily with spacer to prevent coughing or wheezing. 1 Inhaler 5   lamoTRIgine (LAMICTAL) 25 MG tablet Take 2 tablets (50 mg total) by mouth daily. 60 tablet 2   levocetirizine (XYZAL) 5 MG tablet TAKE 1 TABLET BY MOUTH EVERY EVENING.*NEED OFFICE VISIT FOR ADDITIONAL REFILLS PER MD* 30 tablet 0   ondansetron (ZOFRAN-ODT) 4 MG disintegrating tablet Take 1 tablet (4 mg total) by mouth every 8 (eight) hours as needed. 20 tablet 0   pantoprazole (PROTONIX) 40 MG tablet Take 1 tablet (40 mg total) by mouth daily. 30 tablet 0   prochlorperazine (COMPAZINE) 25 MG suppository Place 1 suppository (25 mg total) rectally every 12 (twelve) hours as needed for nausea or vomiting. 12 suppository 0   Spacer/Aero-Holding Chambers DEVI Use as directed 1 Device 1   SUMAtriptan (IMITREX) 100 MG tablet Take 1 tablet (100 mg total) by mouth every 2 (two) hours as needed for migraine. May repeat in 2 hours if headache persists or recurs. 8 tablet 5   [DISCONTINUED] ARIPiprazole (ABILIFY) 10 MG tablet Take 1 tablet (10 mg total) by mouth daily. Stop risperdal, starting abilify 30 tablet 1   [DISCONTINUED] risperiDONE (RISPERDAL) 1 MG tablet Take 1 tablet (1 mg total) by mouth at bedtime. 30 tablet 1   [DISCONTINUED] traZODone (DESYREL) 50 MG tablet Take 1 tablet (50 mg total) by mouth at bedtime. 30 tablet 0   No current facility-administered medications on file prior to  visit.    Review of Systems:  As per HPI- otherwise negative.  Physical Examination: Vitals:   04/02/22 1429  BP: 112/78  Pulse: 83  Resp: 18  Temp: 97.8 F (36.6 C)  SpO2: 98%   Vitals:   04/02/22 1429  Weight: 200 lb 12.8 oz (91.1 kg)  Height: 5\' 3"  (1.6 m)   Body mass index is 35.57 kg/m. Ideal Body Weight: Weight in (lb) to have BMI = 25: 140.8  GEN: no acute distress.  Obese, looks well HEENT: Atraumatic, Normocephalic.  No angioedema Ears and Nose: No external deformity. CV: RRR, No M/G/R. No JVD. No thrill. No extra heart sounds. PULM: CTA B, no wheezes, crackles, rhonchi. No retractions. No resp. distress. No accessory muscle use. ABD: S, NT, ND, +BS. No rebound. No HSM.  Belly is benign EXTR:  No c/c/e PSYCH: Normally interactive. Conversant.    Assessment and Plan: Transaminitis - Plan: Hepatitis, Acute, Hepatic function panel  Allergic reaction, initial encounter - Plan: Ambulatory referral to Allergy  Seen today for follow-up.  As above, she was seen last week following allergic reaction with angioedema and also gastroenteritis.  Thankfully she is now feeling much better Will follow-up on her transaminitis and check an acute hepatitis panel, hepatitis A would be a possibility She has a known nut allergy but did not consume any known nut products prior to recent logic reaction.  Referral back to allergist for reevaluation Advised her to avoid lemon pepper seasoning for now  Signed Abbe Amsterdam, MD

## 2022-04-02 ENCOUNTER — Ambulatory Visit: Payer: 59 | Admitting: Family Medicine

## 2022-04-02 VITALS — BP 112/78 | HR 83 | Temp 97.8°F | Resp 18 | Ht 63.0 in | Wt 200.8 lb

## 2022-04-02 DIAGNOSIS — R7401 Elevation of levels of liver transaminase levels: Secondary | ICD-10-CM

## 2022-04-02 DIAGNOSIS — T7840XA Allergy, unspecified, initial encounter: Secondary | ICD-10-CM | POA: Diagnosis not present

## 2022-04-02 NOTE — Patient Instructions (Signed)
Good to see you today- I am so glad you are feeling better!   We will check on your liver tests and a hepatitis panel today, will refer you to allergist Avoid the lemon pepper wings:(

## 2022-04-03 ENCOUNTER — Encounter: Payer: Self-pay | Admitting: Family Medicine

## 2022-04-03 ENCOUNTER — Other Ambulatory Visit: Payer: Self-pay | Admitting: Family Medicine

## 2022-04-03 DIAGNOSIS — R7401 Elevation of levels of liver transaminase levels: Secondary | ICD-10-CM

## 2022-04-03 LAB — HEPATIC FUNCTION PANEL
ALT: 63 U/L — ABNORMAL HIGH (ref 0–35)
AST: 47 U/L — ABNORMAL HIGH (ref 0–37)
Albumin: 4.5 g/dL (ref 3.5–5.2)
Alkaline Phosphatase: 112 U/L (ref 39–117)
Bilirubin, Direct: 0.1 mg/dL (ref 0.0–0.3)
Total Bilirubin: 0.3 mg/dL (ref 0.2–1.2)
Total Protein: 7.1 g/dL (ref 6.0–8.3)

## 2022-04-03 LAB — HEPATITIS PANEL, ACUTE
Hep A IgM: NONREACTIVE
Hep B C IgM: NONREACTIVE
Hepatitis B Surface Ag: NONREACTIVE
Hepatitis C Ab: NONREACTIVE

## 2022-04-27 ENCOUNTER — Encounter: Payer: Self-pay | Admitting: Family Medicine

## 2022-04-27 ENCOUNTER — Other Ambulatory Visit (HOSPITAL_BASED_OUTPATIENT_CLINIC_OR_DEPARTMENT_OTHER): Payer: Self-pay

## 2022-04-27 DIAGNOSIS — U071 COVID-19: Secondary | ICD-10-CM

## 2022-04-27 MED ORDER — NIRMATRELVIR/RITONAVIR (PAXLOVID)TABLET
3.0000 | ORAL_TABLET | Freq: Two times a day (BID) | ORAL | 0 refills | Status: AC
  Filled 2022-04-27: qty 30, 5d supply, fill #0

## 2022-04-27 MED ORDER — HYDROCODONE BIT-HOMATROP MBR 5-1.5 MG/5ML PO SOLN
5.0000 mL | Freq: Three times a day (TID) | ORAL | 0 refills | Status: DC | PRN
  Filled 2022-04-27: qty 75, 5d supply, fill #0

## 2022-05-08 ENCOUNTER — Encounter (HOSPITAL_COMMUNITY): Payer: Self-pay | Admitting: Psychiatry

## 2022-05-08 ENCOUNTER — Ambulatory Visit (INDEPENDENT_AMBULATORY_CARE_PROVIDER_SITE_OTHER): Payer: 59 | Admitting: Psychiatry

## 2022-05-08 ENCOUNTER — Other Ambulatory Visit (HOSPITAL_BASED_OUTPATIENT_CLINIC_OR_DEPARTMENT_OTHER): Payer: Self-pay

## 2022-05-08 VITALS — BP 108/70 | Temp 98.2°F | Ht 63.0 in | Wt 194.0 lb

## 2022-05-08 DIAGNOSIS — F3130 Bipolar disorder, current episode depressed, mild or moderate severity, unspecified: Secondary | ICD-10-CM | POA: Diagnosis not present

## 2022-05-08 DIAGNOSIS — Z789 Other specified health status: Secondary | ICD-10-CM

## 2022-05-08 DIAGNOSIS — F5102 Adjustment insomnia: Secondary | ICD-10-CM

## 2022-05-08 DIAGNOSIS — F411 Generalized anxiety disorder: Secondary | ICD-10-CM | POA: Diagnosis not present

## 2022-05-08 MED ORDER — LAMOTRIGINE 25 MG PO TABS
75.0000 mg | ORAL_TABLET | Freq: Every day | ORAL | 1 refills | Status: DC
  Filled 2022-05-08: qty 90, 30d supply, fill #0
  Filled 2022-06-10 – 2022-06-14 (×2): qty 90, 30d supply, fill #1

## 2022-05-08 MED ORDER — FLUOXETINE HCL 20 MG PO CAPS
40.0000 mg | ORAL_CAPSULE | Freq: Every day | ORAL | 1 refills | Status: DC
  Filled 2022-05-08: qty 60, 30d supply, fill #0
  Filled 2022-06-10 – 2022-06-14 (×2): qty 60, 30d supply, fill #1

## 2022-05-08 MED ORDER — CLONAZEPAM 0.5 MG PO TABS
0.5000 mg | ORAL_TABLET | Freq: Every day | ORAL | 0 refills | Status: DC | PRN
Start: 2022-05-08 — End: 2022-10-22
  Filled 2022-05-08: qty 30, 30d supply, fill #0

## 2022-05-08 NOTE — Progress Notes (Signed)
[facility] Follow up Visit  Patient Identification: Alexis Dixon MRN:  [id number] Date of Evaluation:  05/08/2022 Referral Source Dr. Patsy Lager Chief Complaint:  [name] care depression, mania  Visit Diagnosis:    ICD-10-CM   1. Bipolar I disorder, most recent episode depressed (HCC)  F31.30     2. GAD (generalized anxiety disorder)  F41.1 FLUoxetine (PROZAC) 20 MG capsule    3. Adjustment insomnia  F51.02     4. Alcohol use  Z78.9       History of Present Illness: Patient is a 26 years old currently married African-American female initially referred by primary care physician to establish care for possible bipolar she has been admitted rfor 4 days at [facility] with elevated mood talking to herself confusion and possible manic episode.  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    Consequences of Substance Abuse: Use of Gummies CBD  Past Medical History:  Past Medical History:  Diagnosis Date   Allergy    Tree Nuts ; Claritin D   Asthma    childhood; Albuterol and Singulair; no hospitalizations and ED visits.   Eczema    Headache(784.0)     Past Surgical History:  Procedure Laterality Date   WISDOM TOOTH EXTRACTION      Family Psychiatric History: Sister Bipolar  Family History:  Family History  Problem Relation Age of Onset   Diabetes Mother     Hyperlipidemia Maternal Grandmother    Hypertension Maternal Grandmother    Diabetes Maternal Grandmother    Cancer Maternal Grandmother        breast   Asthma Paternal Grandmother    Asthma Sister    Eczema Maternal Grandfather     Social History:   Social History   Socioeconomic History   Marital status: Married    Spouse name: Not on file   Number of children: Not on file   Years of education: 9   Highest education level: Not on file  Occupational History   Not on file  Tobacco Use   Smoking status: Never    Passive exposure: Never   Smokeless tobacco: Never  Vaping Use   Vaping Use: Never used  Substance and Sexual Activity   Alcohol use: Yes    Comment: socialy   Drug use: No   Sexual activity: Yes    Birth control/protection: I.U.D.  Other Topics Concern   Not on file  Social History Narrative   Marital Status: Single ; not dating   Living Situation: Lives with  Parents, sister (50)   Education: 11th Grade ([employer]) ; makes ABs; favorite subject English; career plans nursing.  No fails or being held back.   Tobacco Use/Exposure:  None    Diet:  Regular   Favorite Subject:  History    Hobbies:  Adult nurse Games; reading;  into books.  Teenage books.  Comic books.            Social Determinants of Health   Financial Resource Strain: Not on file  Food Insecurity: Not on file  Transportation Needs: Not on file  Physical Activity: Not on file  Stress: Not on file  Social Connections: Not on file    Allergies:   Allergies  Allergen Reactions   Other Swelling    Tree Nuts     Metabolic Disorder Labs: Lab Results  Component Value Date   HGBA1C 5.8 10/07/2019   No results found for: "PROLACTIN" Lab Results  Component Value Date   CHOL 117 10/07/2019   TRIG 38.0 10/07/2019   HDL 48.20 10/07/2019   CHOLHDL 2 10/07/2019   VLDL 7.6 10/07/2019   LDLCALC 61 10/07/2019   LDLCALC 49 01/27/2018   Lab Results  Component Value Date    TSH 2.746 03/26/2022    Therapeutic Level Labs: No results found for: "LITHIUM" No results found for: "CBMZ" No results found for: "VALPROATE"  Current Medications: Current Outpatient Medications  Medication Sig Dispense Refill   Adapalene 0.3 % gel Apply topically at bedtime 45 g 6   albuterol (PROVENTIL HFA;VENTOLIN HFA) 108 (90 Base) MCG/ACT inhaler Inhale 2 puffs into the lungs every 6 (six) hours as needed for wheezing or shortness of breath. Reported on 08/16/2015 18 g 1   dicyclomine (BENTYL) 20 MG tablet Take 1 tablet (20 mg total) by mouth 3 (three) times daily as needed for spasms. 20 tablet 0   EPINEPHrine 0.3 mg/0.3 mL IJ SOAJ injection Inject 0.3 mg into the muscle as needed for anaphylaxis. 2 each PRN   fluticasone (FLONASE) 50 MCG/ACT nasal spray 2 sprays per nostril once daily if needed for stuffy nose. 16 g 5   fluticasone (FLOVENT HFA) 110 MCG/ACT inhaler 2 puffs twice daily with spacer to prevent coughing or wheezing. 1 Inhaler 5   HYDROcodone bit-homatropine (HYCODAN) 5-1.5 MG/5ML syrup Take 5 mLs by mouth every 8 (eight) hours as needed for cough. 75 mL 0   levocetirizine (XYZAL) 5 MG tablet TAKE 1 TABLET BY MOUTH EVERY EVENING.*NEED OFFICE VISIT FOR ADDITIONAL REFILLS PER MD* 30 tablet 0   ondansetron (ZOFRAN-ODT) 4 MG disintegrating tablet Take 1 tablet (4 mg total) by mouth every 8 (eight) hours as needed. 20 tablet 0   pantoprazole (PROTONIX) 40 MG tablet Take 1 tablet (40 mg total) by mouth daily. 30 tablet 0   prochlorperazine (COMPAZINE) 25 MG suppository Place 1 suppository (25 mg total) rectally every 12 (twelve) hours as needed for nausea or vomiting. 12 suppository 0   Spacer/Aero-Holding Chambers DEVI Use as directed 1 Device 1   SUMAtriptan (IMITREX) 100 MG tablet Take 1 tablet (100 mg total) by mouth every 2 (two) hours as needed for migraine. May repeat in 2 hours if headache persists or recurs. 8 tablet 5   clonazePAM (KLONOPIN) 0.5 MG tablet Take 1  tablet (0.5 mg total) by mouth daily as needed. 30 tablet 0   FLUoxetine (PROZAC) 20 MG capsule Take 2 capsules (40 mg total) by mouth daily. 60 capsule 1   lamoTRIgine (LAMICTAL) 25 MG tablet Take 3 tablets (75 mg total) by mouth daily. 90 tablet 1   No current facility-administered medications for this visit.     Psychiatric Specialty Exam: Review of Systems  Constitutional:  Positive for fatigue.  Cardiovascular:  Negative for chest pain.  Neurological:  Negative for tremors.  Psychiatric/Behavioral:  Positive for  sleep disturbance. Negative for agitation and suicidal ideas.     Blood pressure 108/70, temperature 98.2 F (36.8 C), height 5\' 3"  (1.6 m), weight 194 lb (88 kg).Body mass index is 34.37 kg/m.  General Appearance: Casual  Eye Contact:  Fair  Speech:  Normal Rate  Volume:  Decreased  Mood: somewhat anxious  Affect:  Constricted  Thought Process:  Goal Directed  Orientation:  Full (Time, Place, and Person)  Thought Content:  Rumination  Suicidal Thoughts:  No  Homicidal Thoughts:  No  Memory:  Immediate;   Fair  Judgement:  Fair  Insight:  Shallow  Psychomotor Activity:  Decreased  Concentration:  Concentration: Fair  Recall:  Fair  Fund of Knowledge:Fair  Language: Good  Akathisia:  No  Handed:   AIMS (if indicated):  no involuntary movements  Assets:  Financial Resources/Insurance Housing Social Support  ADL's:  Intact  Cognition: WNL  Sleep:   variable   Screenings: from 06/27/2021 in [facility] [place] Video Visit from 05/22/2021 in [facility] [place] Office Visit from 03/24/2021 in [place] [facility] [place] Office Visit from 10/07/2019 in [facility] [place] Office Visit from 01/27/2018 in [facility]  PHQ-2 Total Score 4 2 0 6 0  PHQ-9 Total Score 16 8  -- 24 --      Flowsheet Row Office Visit from 05/08/2022 in [facility] AT [place] ED from 03/26/2022 in [facility] Office Visit from 12/05/2021 in [facility] AT [place]  C-SSRS RISK CATEGORY No Risk No Risk No Risk       Assessment and Plan: As follows  Prior documentation reviewed   Bipolar disorder current episode depressed; gets subdued, increase lamictal to  75mg  , no rash    Generalized anxiety disorder; feels over whelmed at times or free floating anxiety effecting sleep, continue prozac, can take klonopine prn 0.5mg  avoid alcohol    Insomnia; difficulty sleeping, avoid alcohol ,reviewed sleep hygine, can take small dose klonopine at night  Understands the risk of alcohol use and its effect on mood, depression   Fu 86m. Consider AA but not interested.  Direct care time spent in office 20 min including face to face, review chart and documentation    , MD 9/12/202310:25 AM

## 2022-05-17 ENCOUNTER — Encounter: Payer: Self-pay | Admitting: Family Medicine

## 2022-05-17 ENCOUNTER — Other Ambulatory Visit (HOSPITAL_BASED_OUTPATIENT_CLINIC_OR_DEPARTMENT_OTHER): Payer: Self-pay

## 2022-05-17 ENCOUNTER — Ambulatory Visit: Payer: 59 | Admitting: Family Medicine

## 2022-05-17 VITALS — BP 110/60 | HR 91 | Temp 97.8°F | Resp 18 | Ht 63.0 in | Wt 195.0 lb

## 2022-05-17 DIAGNOSIS — T22211A Burn of second degree of right forearm, initial encounter: Secondary | ICD-10-CM | POA: Diagnosis not present

## 2022-05-17 DIAGNOSIS — Z23 Encounter for immunization: Secondary | ICD-10-CM

## 2022-05-17 MED ORDER — SILVER SULFADIAZINE 1 % EX CREA
1.0000 | TOPICAL_CREAM | Freq: Every day | CUTANEOUS | 1 refills | Status: DC | PRN
  Filled 2022-05-17: qty 50, 30d supply, fill #0

## 2022-05-17 MED ORDER — TRAMADOL HCL 50 MG PO TABS
50.0000 mg | ORAL_TABLET | Freq: Three times a day (TID) | ORAL | 0 refills | Status: DC | PRN
  Filled 2022-05-17: qty 20, 7d supply, fill #0

## 2022-05-17 MED ORDER — CEPHALEXIN 500 MG PO CAPS
500.0000 mg | ORAL_CAPSULE | Freq: Three times a day (TID) | ORAL | 0 refills | Status: DC
  Filled 2022-05-17: qty 15, 5d supply, fill #0
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
## 2022-05-17 NOTE — Telephone Encounter (Signed)
Pt has been scheduled.  °

## 2022-05-17 NOTE — Telephone Encounter (Signed)
Would you recommend an OV?

## 2022-06-10 ENCOUNTER — Other Ambulatory Visit (HOSPITAL_COMMUNITY): Payer: Self-pay | Admitting: Emergency Medicine

## 2022-06-11 ENCOUNTER — Other Ambulatory Visit (HOSPITAL_COMMUNITY): Payer: Self-pay

## 2022-06-12 ENCOUNTER — Other Ambulatory Visit (HOSPITAL_COMMUNITY): Payer: Self-pay

## 2022-06-14 ENCOUNTER — Other Ambulatory Visit (HOSPITAL_BASED_OUTPATIENT_CLINIC_OR_DEPARTMENT_OTHER): Payer: Self-pay

## 2022-06-14 ENCOUNTER — Other Ambulatory Visit (HOSPITAL_COMMUNITY): Payer: Self-pay

## 2022-06-15 ENCOUNTER — Other Ambulatory Visit (HOSPITAL_BASED_OUTPATIENT_CLINIC_OR_DEPARTMENT_OTHER): Payer: Self-pay

## 2022-07-05 ENCOUNTER — Ambulatory Visit (HOSPITAL_COMMUNITY): Payer: 59 | Admitting: Psychiatry

## 2022-07-16 ENCOUNTER — Other Ambulatory Visit (HOSPITAL_COMMUNITY): Payer: Self-pay

## 2022-08-07 ENCOUNTER — Other Ambulatory Visit (HOSPITAL_COMMUNITY)
Admission: RE | Admit: 2022-08-07 | Discharge: 2022-08-07 | Disposition: A | Discharge status: Home / Self Care | Payer: 59 | Source: Ambulatory Visit | Attending: Radiology | Admitting: Radiology

## 2022-08-07 ENCOUNTER — Encounter: Payer: Self-pay | Admitting: Radiology

## 2022-08-07 ENCOUNTER — Ambulatory Visit (INDEPENDENT_AMBULATORY_CARE_PROVIDER_SITE_OTHER): Payer: 59 | Admitting: Radiology

## 2022-08-07 VITALS — BP 116/82 | Ht 63.0 in | Wt 204.0 lb

## 2022-08-07 DIAGNOSIS — Z01419 Encounter for gynecological examination (general) (routine) without abnormal findings: Secondary | ICD-10-CM | POA: Diagnosis not present

## 2022-08-07 DIAGNOSIS — Z30432 Encounter for removal of intrauterine contraceptive device: Secondary | ICD-10-CM

## 2022-08-07 NOTE — Progress Notes (Signed)
   Alexis Dixon Oct 23, 1995 [id number]   History:  26 y.o. G0 presents for annual exam as a new patient. Desires IUD removal, planning pregnancy.  Gynecologic History No LMP recorded. (Menstrual status: IUD).   Contraception/Family planning: IUD Sexually active: yes Last Pap: 2020. Results were: normal   Obstetric History OB History  Gravida Para Term Preterm AB Living  0 0 0 0 0 0  SAB IAB Ectopic Multiple Live Births  0 0 0 0 0     The following portions of the patient's history were reviewed and updated as appropriate: allergies, current medications, past family history, past medical history, past social history, past surgical history, and problem list.  Review of Systems Pertinent items noted in HPI and remainder of comprehensive ROS otherwise negative.   Past medical history, past surgical history, family history and social history were all reviewed and documented in the EPIC chart.   Exam:  Vitals:   08/07/22 1338  BP: 116/82  Weight: 204 lb (92.5 kg)  Height: 5\' 3"  (1.6 m)   Body mass index is 36.14 kg/m.  General appearance:  Normal Thyroid:  Symmetrical, normal in size, without palpable masses or nodularity. Respiratory  Auscultation:  Clear without wheezing or rhonchi Cardiovascular  Auscultation:  Regular rate, without rubs, murmurs or gallops  Edema/varicosities:  Not grossly evident Abdominal  Soft,nontender, without masses, guarding or rebound.  Liver/spleen:  No organomegaly noted  Hernia:  None appreciated  Skin  Inspection:  Grossly normal Breasts: Examined lying and sitting.   Right: Without masses, retractions, nipple discharge or axillary adenopathy.   Left: Without masses, retractions, nipple discharge or axillary adenopathy. Genitourinary   Inguinal/mons:  Normal without inguinal adenopathy  External genitalia:  Normal appearing vulva with no masses, tenderness, or lesions  BUS/Urethra/Skene's glands:  Normal without masses or  exudate  Vagina:  Normal appearing with normal color and discharge, no lesions  Cervix:  Normal appearing without discharge or lesions. IUD strings grasped with forceps and removed with gentle traction, tolerated well  Uterus:  Normal in size, shape and contour.  Mobile, nontender  Adnexa/parametria:     Rt: Normal in size, without masses or tenderness.   Lt: Normal in size, without masses or tenderness.  Anus and perineum: Normal     Patient informed chaperone available to be present for breast and pelvic exam. Patient has requested no chaperone to be present. Patient has been advised what will be completed during breast and pelvic exam.   Assessment/Plan:   1. Well woman exam with routine gynecological exam  - Cytology - PAP( [place])  2. Encounter for IUD removal Begin PNV Call with positive pregnancy test     Discussed SBE, pap screening as directed/appropriate. Recommend of exercise weekly, including weight bearing exercise. Encouraged the use of seatbelts and sunscreen. Return in 1 year for annual or as needed.   B WHNP-BC 2:25 PM 08/07/2022

## 2022-08-09 LAB — CYTOLOGY - PAP: Diagnosis: NEGATIVE

## 2022-08-14 DIAGNOSIS — H5213 Myopia, bilateral: Secondary | ICD-10-CM | POA: Diagnosis not present

## 2022-08-16 ENCOUNTER — Encounter (HOSPITAL_COMMUNITY): Payer: Self-pay | Admitting: Psychiatry

## 2022-08-16 ENCOUNTER — Other Ambulatory Visit (HOSPITAL_BASED_OUTPATIENT_CLINIC_OR_DEPARTMENT_OTHER): Payer: Self-pay

## 2022-08-16 ENCOUNTER — Ambulatory Visit (INDEPENDENT_AMBULATORY_CARE_PROVIDER_SITE_OTHER): Payer: 59 | Admitting: Psychiatry

## 2022-08-16 VITALS — BP 102/88 | HR 101 | Ht 63.0 in | Wt 201.8 lb

## 2022-08-16 DIAGNOSIS — F3130 Bipolar disorder, current episode depressed, mild or moderate severity, unspecified: Secondary | ICD-10-CM | POA: Diagnosis not present

## 2022-08-16 DIAGNOSIS — F411 Generalized anxiety disorder: Secondary | ICD-10-CM | POA: Diagnosis not present

## 2022-08-16 DIAGNOSIS — F5102 Adjustment insomnia: Secondary | ICD-10-CM

## 2022-08-16 MED ORDER — FLUOXETINE HCL 20 MG PO CAPS
40.0000 mg | ORAL_CAPSULE | Freq: Every day | ORAL | 1 refills | Status: DC
  Filled 2022-08-16: qty 60, 30d supply, fill #0
  Filled 2022-10-04: qty 60, 30d supply, fill #1

## 2022-08-16 MED ORDER — LAMOTRIGINE 100 MG PO TABS
100.0000 mg | ORAL_TABLET | Freq: Every day | ORAL | 0 refills | Status: DC
Start: 2022-08-16 — End: 2022-10-04
  Filled 2022-08-16: qty 30, 30d supply, fill #0

## 2022-08-16 NOTE — Progress Notes (Signed)
[facility] Follow up Visit  Patient Identification: Alexis Dixon MRN:  [id number] Date of Evaluation:  08/16/2022 Referral Source Dr. Patsy Lager Chief Complaint:  [name] care depression, mania  Visit Diagnosis:    ICD-10-CM   1. Bipolar I disorder, most recent episode depressed (HCC)  F31.30     2. GAD (generalized anxiety disorder)  F41.1 FLUoxetine (PROZAC) 20 MG capsule    3. Adjustment insomnia  F51.02       History of Present Illness: Patient is a 26 years old currently married African-American female initially referred by primary care physician to establish care for possible bipolar she has been admitted rfor 4 days at [facility] with elevated mood talking to herself confusion and possible manic episode.  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    Consequences of Substance Abuse: Use of Gummies CBD  Past Medical History:  Past Medical History:  Diagnosis Date   Allergy    Tree Nuts ; Claritin D   Asthma    childhood; Albuterol and Singulair; no hospitalizations and ED visits.   Bipolar depression (HCC)    Eczema    Headache(784.0)     Past Surgical History:  Procedure Laterality Date   WISDOM TOOTH EXTRACTION      Family Psychiatric History: Sister Bipolar  Family History:  Family History  Problem Relation Age of Onset   Diabetes Mother    Hyperlipidemia Maternal Grandmother    Hypertension Maternal  Grandmother    Diabetes Maternal Grandmother    Cancer Maternal Grandmother        breast   Asthma Paternal Grandmother    Asthma Sister    Eczema Maternal Grandfather     Social History:   Social History   Socioeconomic History   Marital status: Married    Spouse name: Not on file   Number of children: Not on file   Years of education: 9   Highest education level: Not on file  Occupational History   Not on file  Tobacco Use   Smoking status: Never    Passive exposure: Never   Smokeless tobacco: Never  Vaping Use   Vaping Use: Never used  Substance and Sexual Activity   Alcohol use: Yes    Comment: socially   Drug use: Yes    Types: Marijuana    Comment: CBD   Sexual activity: Yes    Partners: Male    Birth control/protection: I.U.D.    Comment: menarche 26yo, sexual debut 26yo  Other Topics Concern   Not on file  Social History Narrative   Marital Status: Single ; not dating   Living Situation: Lives with  Parents, sister (13)   Education: 11th Grade ([employer]) ; makes ABs; favorite subject English; career plans nursing.  No fails or being held back.   Tobacco Use/Exposure:  None    Diet:  Regular   Favorite Subject:  History    Hobbies:  Playing Video Games; reading; into books.  Teenage books.  Comic books.            Social Determinants of Health   Financial Resource Strain: Not on file  Food Insecurity: Not on file  Transportation Needs: Not on file  Physical Activity: Not on file  Stress: Not on file  Social Connections: Not on file    Allergies:   Allergies  Allergen Reactions   Other Swelling    Tree Nuts     Metabolic Disorder Labs: Lab Results  Component Value Date   HGBA1C 5.8 10/07/2019   No results found for: "PROLACTIN" Lab Results  Component Value Date   CHOL 117 10/07/2019   TRIG 38.0 10/07/2019   HDL 48.20 10/07/2019   CHOLHDL 2 10/07/2019   VLDL 7.6 10/07/2019   LDLCALC 61 10/07/2019   LDLCALC 49  01/27/2018   Lab Results  Component Value Date   TSH 2.746 03/26/2022    Therapeutic Level Labs: No results found for: "LITHIUM" No results found for: "CBMZ" No results found for: "VALPROATE"  Current Medications: Current Outpatient Medications  Medication Sig Dispense Refill   clonazePAM (KLONOPIN) 0.5 MG tablet Take 1 tablet (0.5 mg total) by mouth daily as needed. 30 tablet 0   EPINEPHrine 0.3 mg/0.3 mL IJ SOAJ injection Inject 0.3 mg into the muscle as needed for anaphylaxis. 2 each PRN   ondansetron (ZOFRAN-ODT) 4 MG disintegrating tablet Take 1 tablet (4 mg total) by mouth every 8 (eight) hours as needed. 20 tablet 0   FLUoxetine (PROZAC) 20 MG capsule Take 2 capsules (40 mg total) by mouth daily. 60 capsule 1   lamoTRIgine (LAMICTAL) 100 MG tablet Take 1 tablet (100 mg total) by mouth daily. 30 tablet 0   Spacer/Aero-Holding [facility] Use as directed (Patient not taking: Reported on 08/16/2022) 1 Device 1   No current facility-administered medications for this visit.     Psychiatric Specialty Exam: Review of Systems  Cardiovascular:  Negative for chest pain.  Neurological:  Negative for tremors.  Psychiatric/Behavioral:  Positive for dysphoric mood. Negative for agitation and suicidal ideas.     Blood pressure 102/88, pulse (!) 101, height 5\' 3"  (1.6 m), weight 201 lb 12.8 oz (91.5 kg), SpO2 98 %.Body mass index is 35.75 kg/m.  General Appearance: Casual  Eye Contact:  Fair  Speech:  Normal Rate  Volume:  Decreased  Mood: somewhat subdued  Affect:  Constricted  Thought Process:  Goal Directed  Orientation:  Full (Time, Place, and Person)  Thought Content:  Rumination  Suicidal Thoughts:  No  Homicidal Thoughts:  No  Memory:  Immediate;   Fair  Judgement:  Fair  Insight:  Shallow  Psychomotor Activity:  Decreased  Concentration:  Concentration: Fair  Recall:  Fair  Fund of Knowledge:Fair  Language: Good  Akathisia:  No  Handed:   AIMS (if indicated):   no involuntary movements  Assets:  Financial Resources/Insurance Housing Social Support  ADL's:  Intact  Cognition: WNL  Sleep:   variable   Screenings: GAD-7    Flowsheet Row Office Visit from 08/16/2022 in [facility] AT [place]  Total GAD-7 Score 14      PHQ2-9    Flowsheet Row Office Visit from 08/16/2022 in [facility] AT [place] Counselor from 06/27/2021 in [facility] AT [place] Video Visit from 05/22/2021 in [facility] AT [place] Office Visit from 03/24/2021 in [facility]  [place] Office Visit from 10/07/2019 in [facility]  PHQ-2 Total Score 4 4 2  0 6  PHQ-9 Total Score 16 16 8  -- 24      Flowsheet Row Office Visit from 08/16/2022 in [facility] AT [place] Office Visit from 05/08/2022 in [facility] AT [place] ED from 03/26/2022 in [facility]  C-SSRS RISK CATEGORY Error: Q3, 4, or 5 should not be populated when Q2 is No No Risk No Risk       Assessment and Plan: As follows  Prior documentation reviewed   Bipolar depressed: somewhat subdued,  increase lamictal to 100mg , disucssed stresors and to bring husband next appointment to have more elaborate on her depression,stressors   Generalized anxiety disorder;  fluctuates, continue prozac, hold off klonopoine says may want get pregnant, discussed risk benefit of meds and to consult with OB as well     Insomnia;fluctuates, discussed sleep wake cycle and night shift work effect . Reviewed sleep hygiene Avoid alcohol and discussed risks   Fu 15m. Consider AA but not interested.  Direct care time spent in office  20 minutes  including face to face, review chart and documentation    03/28/2022, MD 12/21/202310:24 AM

## 2022-10-04 ENCOUNTER — Other Ambulatory Visit (HOSPITAL_COMMUNITY): Payer: Self-pay | Admitting: Psychiatry

## 2022-10-04 ENCOUNTER — Other Ambulatory Visit (HOSPITAL_BASED_OUTPATIENT_CLINIC_OR_DEPARTMENT_OTHER): Payer: Self-pay

## 2022-10-04 MED ORDER — LAMOTRIGINE 100 MG PO TABS
100.0000 mg | ORAL_TABLET | Freq: Every day | ORAL | 0 refills | Status: DC
Start: 2022-10-04 — End: 2022-10-16
  Filled 2022-10-04: qty 30, 30d supply, fill #0

## 2022-10-15 LAB — CBC AND DIFFERENTIAL
HCT: 41 (ref 36–46)
Hemoglobin: 13.5 (ref 12.0–16.0)
Platelets: 362 10*3/uL (ref 150–400)
WBC: 5.2

## 2022-10-15 LAB — BASIC METABOLIC PANEL
BUN: 6 (ref 4–21)
CO2: 25 — AB (ref 13–22)
Chloride: 102 (ref 99–108)
Creatinine: 0.7 (ref 0.5–1.1)
Glucose: 94
Potassium: 3.9 mEq/L (ref 3.5–5.1)
Sodium: 139 (ref 137–147)

## 2022-10-15 LAB — LIPID PANEL
Cholesterol: 148 (ref 0–200)
HDL: 53 (ref 35–70)
LDL Cholesterol: 84
Triglycerides: 54 (ref 40–160)

## 2022-10-15 LAB — HEPATIC FUNCTION PANEL
ALT: 19 U/L (ref 7–35)
AST: 17 (ref 13–35)
Alkaline Phosphatase: 116 (ref 25–125)

## 2022-10-15 LAB — TSH: TSH: 1.1 (ref 0.41–5.90)

## 2022-10-15 LAB — COMPREHENSIVE METABOLIC PANEL
Calcium: 9.7 (ref 8.7–10.7)
eGFR: 124

## 2022-10-15 LAB — VITAMIN D 25 HYDROXY (VIT D DEFICIENCY, FRACTURES): Vit D, 25-Hydroxy: 26.7

## 2022-10-15 LAB — HEMOGLOBIN A1C: Hemoglobin A1C: 5.4

## 2022-10-16 ENCOUNTER — Other Ambulatory Visit (HOSPITAL_BASED_OUTPATIENT_CLINIC_OR_DEPARTMENT_OTHER): Payer: Self-pay

## 2022-10-16 MED ORDER — CLONAZEPAM 0.5 MG PO TABS
0.5000 mg | ORAL_TABLET | Freq: Every day | ORAL | 0 refills | Status: DC
  Filled 2022-10-16: qty 30, 30d supply, fill #0

## 2022-10-16 MED ORDER — MELATONIN 3 MG PO TABS
3.0000 mg | ORAL_TABLET | Freq: Every day | ORAL | 0 refills | Status: DC
Start: 2022-10-16 — End: 2022-10-22
  Filled 2022-10-16: qty 200, 90d supply, fill #0

## 2022-10-16 MED ORDER — LAMOTRIGINE 100 MG PO TABS
100.0000 mg | ORAL_TABLET | Freq: Every day | ORAL | 0 refills | Status: DC
Start: 2022-10-16 — End: 2022-12-14
  Filled 2022-10-16 – 2022-10-31 (×3): qty 30, 30d supply, fill #0

## 2022-10-16 MED ORDER — QUETIAPINE FUMARATE 25 MG PO TABS
25.0000 mg | ORAL_TABLET | Freq: Every day | ORAL | 0 refills | Status: AC
  Filled 2022-10-16: qty 30, 30d supply, fill #0

## 2022-10-16 NOTE — Progress Notes (Signed)
[place] at [facility] [address], [place], [place] [address] [phone]  Date:  10/22/2022   Name:  Alexis Dixon   DOB:  21-Apr-1996   MRN:  [id number]  PCP:  Darreld Mclean, MD    Chief Complaint: Hospitalization Follow-up (discharged 10/16/22 s/w Brayton Layman from [facility]. Pt asks for Rxs for "cleansing soaps" and Vistaril. )   History of Present Illness:  Alexis Dixon is a 27 y.o. very pleasant female patient who presents with the following:  Pt seen today for hospital admission follow-up/ physical exam  Last seen by myself in September  History of prediabetes, GERD, asthma, allergies   She was recently admitted to behavorial health for 4 days with exacerbation of bipolar symptoms  Pt notes she was having mania sx- she accidentally stopped taking her lamictal for a few days (she got confused about her meds by mistake)  She is just on lamictal and seroquel right now-her mania symptoms are back under control.  She is accompanied today by her long-term partner Ysidro Evert who I have met previously.  He is supportive She feels like the seroquel is making her really tired; she would like to eventually get off of it if possible at some point  She is going to outpatient [place] through [facility]  She notes she might like to seek a new psychiatrist, she does have private insurance.  I gave her a few options to look into  She had fairly extensive lab work done during her behavioral health hospitalization, I have printed these out and they will be abstracted.  Labs done including A1c, thyroid, lipid, CMP, CBC Her Pap is also up-to-date    Patient Active Problem List   Diagnosis Date Noted   Pre-diabetes 12/30/2019   Seasonal and perennial allergic rhinitis 04/09/2019   Chronic idiopathic urticaria 04/09/2019   Gastroesophageal reflux disease 04/09/2019   Mild intermittent asthma without complication [id number]   Dermographia  09/23/2017   Wheezing 11/29/2016   Other seasonal allergic rhinitis 01/24/2016   Anaphylactic shock due to adverse food reaction 01/24/2016   Seasonal allergic conjunctivitis 01/24/2016   Headache(784.0) 07/05/2013    Past Medical History:  Diagnosis Date   Allergy    Tree Nuts ; Claritin D   Asthma    childhood; Albuterol and Singulair; no hospitalizations and ED visits.   Bipolar depression ([place])    Eczema    Headache(784.0)     Past Surgical History:  Procedure Laterality Date   WISDOM TOOTH EXTRACTION      Social History   Tobacco Use   Smoking status: Never    Passive exposure: Never   Smokeless tobacco: Never  Vaping Use   Vaping Use: Never used  Substance Use Topics   Alcohol use: Yes    Comment: socially   Drug use: Yes    Types: Marijuana    Comment: CBD    Family History  Problem Relation Age of Onset   Diabetes Mother    Hyperlipidemia Maternal Grandmother    Hypertension Maternal Grandmother    Diabetes Maternal Grandmother    Cancer Maternal Grandmother        breast   Asthma Paternal Grandmother    Asthma Sister    Eczema Maternal Grandfather     Allergies  Allergen Reactions   Other Swelling    Tree Nuts     Medication list has been reviewed and updated.  Current Outpatient Medications  on File Prior to Visit  Medication Sig Dispense Refill   EPINEPHrine 0.3 mg/0.3 mL IJ SOAJ injection Inject 0.3 mg into the muscle as needed for anaphylaxis. 2 each PRN   lamoTRIgine (LAMICTAL) 100 MG tablet Take 1 tablet (100 mg total) by mouth daily. 30 tablet 0   ondansetron (ZOFRAN-ODT) 4 MG disintegrating tablet Take 1 tablet (4 mg total) by mouth every 8 (eight) hours as needed. 20 tablet 0   QUEtiapine (SEROQUEL) 25 MG tablet Take 1 tablet (25 mg total) by mouth at bedtime. 30 tablet 0   [DISCONTINUED] ARIPiprazole (ABILIFY) 10 MG tablet Take 1 tablet (10 mg total) by mouth daily. Stop risperdal, starting abilify 30 tablet 1   [DISCONTINUED]  risperiDONE (RISPERDAL) 1 MG tablet Take 1 tablet (1 mg total) by mouth at bedtime. 30 tablet 1   [DISCONTINUED] traZODone (DESYREL) 50 MG tablet Take 1 tablet (50 mg total) by mouth at bedtime. 30 tablet 0   No current facility-administered medications on file prior to visit.    Review of Systems:  As per HPI- otherwise negative.   Physical Examination: Vitals:   10/22/22 1510  BP: 110/72  Pulse: 92  Resp: 18  Temp: 97.8 F (36.6 C)  SpO2: 98%   Vitals:   10/22/22 1510  Weight: 196 lb 6.4 oz (89.1 kg)  Height: '5\' 3"'$  (1.6 m)   Body mass index is 34.79 kg/m. Ideal Body Weight: Weight in (lb) to have BMI = 25: 140.8  GEN: no acute distress.  Obese, looks well HEENT: Atraumatic, Normocephalic.  Bilateral TM wnl, oropharynx normal.  PEERL,EOMI.   Ears and Nose: No external deformity. CV: RRR, No M/G/R. No JVD. No thrill. No extra heart sounds. PULM: CTA B, no wheezes, crackles, rhonchi. No retractions. No resp. distress. No accessory muscle use. ABD: S, NT, ND, +BS. No rebound. No HSM. EXTR: No c/c/e PSYCH: Normally interactive. Conversant.    Assessment and Plan: Physical exam  Bipolar disorder in full remission, most recent episode unspecified type ([place])  Vaginal itching - Plan: fluconazole (DIFLUCAN) 150 MG tablet  Patient seen today for physical exam.  Encouraged healthy diet and exercise routine Lab work is up-to-date, will be abstracted She is currently under psychiatrist care though she may seek a new provider.  I encouraged her not to wait on her current provider until she has secured a new one and she states understanding For now continue Seroquel and Lamictal as directed.  She also requests a dose of Diflucan for vaginal yeast infection   Signed Lamar Blinks, MD

## 2022-10-18 ENCOUNTER — Ambulatory Visit (HOSPITAL_COMMUNITY): Payer: Commercial Managed Care - PPO | Admitting: Psychiatry

## 2022-10-18 ENCOUNTER — Other Ambulatory Visit (HOSPITAL_BASED_OUTPATIENT_CLINIC_OR_DEPARTMENT_OTHER): Payer: Self-pay

## 2022-10-20 NOTE — Patient Instructions (Incomplete)
It was good to see you again today, I am glad you are feeling better!  There are many psychiatrists you might see- esp with telehealth you don't necessarily have to stay local  A few ideas (see if they take your insurance)   [facility] in [place], [place] [address] Phone: [phone] Fax: [phone]  [facility] Address: [address], [place], [place] [address] Phone: [phone]  [facility] Address: [address] [place], [place] [address] (enter through main entrance) [phone] [email or web address] .[facility] Address: [address] [place], [place] [address] Phone: [phone]  [facility] Address: [address], [place] [address] Phone: [phone]  Lots of other options in [place]

## 2022-10-22 ENCOUNTER — Other Ambulatory Visit (HOSPITAL_BASED_OUTPATIENT_CLINIC_OR_DEPARTMENT_OTHER): Payer: Self-pay | Admitting: Emergency Medicine

## 2022-10-22 ENCOUNTER — Ambulatory Visit: Payer: Managed Care, Other (non HMO) | Admitting: Family Medicine

## 2022-10-22 ENCOUNTER — Other Ambulatory Visit (HOSPITAL_BASED_OUTPATIENT_CLINIC_OR_DEPARTMENT_OTHER): Payer: Self-pay

## 2022-10-22 VITALS — BP 110/72 | HR 92 | Temp 97.8°F | Resp 18 | Ht 63.0 in | Wt 196.4 lb

## 2022-10-22 DIAGNOSIS — Z0000 Encounter for general adult medical examination without abnormal findings: Secondary | ICD-10-CM | POA: Diagnosis not present

## 2022-10-22 DIAGNOSIS — F3170 Bipolar disorder, currently in remission, most recent episode unspecified: Secondary | ICD-10-CM

## 2022-10-22 DIAGNOSIS — N898 Other specified noninflammatory disorders of vagina: Secondary | ICD-10-CM | POA: Diagnosis not present

## 2022-10-22 MED ORDER — FLUCONAZOLE 150 MG PO TABS
150.0000 mg | ORAL_TABLET | Freq: Once | ORAL | 0 refills | Status: AC
  Filled 2022-10-22 – 2022-10-31 (×2): qty 2, 2d supply, fill #0

## 2022-10-24 ENCOUNTER — Other Ambulatory Visit (HOSPITAL_BASED_OUTPATIENT_CLINIC_OR_DEPARTMENT_OTHER): Payer: Self-pay

## 2022-10-29 ENCOUNTER — Other Ambulatory Visit (HOSPITAL_BASED_OUTPATIENT_CLINIC_OR_DEPARTMENT_OTHER): Payer: Self-pay

## 2022-10-31 ENCOUNTER — Other Ambulatory Visit (HOSPITAL_BASED_OUTPATIENT_CLINIC_OR_DEPARTMENT_OTHER): Payer: Self-pay

## 2022-12-08 IMAGING — CT CT ABD-PELV W/ CM
2 of 4 series · 16 of 46 positions shown, 18 images · IV contrast (APPLIED)
Comparison: None Available.

CLINICAL DATA: Right lower quadrant abdominal pain. Nausea, emesis,
diarrhea, and blood in stool.

EXAM:
CT ABDOMEN AND PELVIS WITH CONTRAST
TECHNIQUE: Multidetector CT imaging of the abdomen and pelvis was performed
using the standard protocol following bolus administration of
intravenous contrast.

[Series 2: abd pel w · axial · 0.88mm/px · z∈[-510,-85]mm · 13 of 93 slices shown, 15 images]
[im 4/93  soft-tissue]
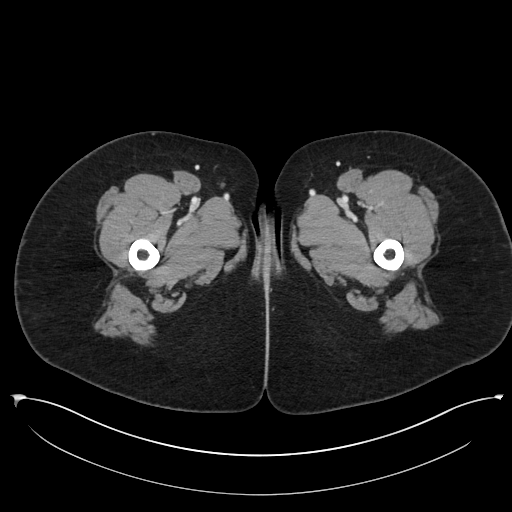
[im 4/93  bone]
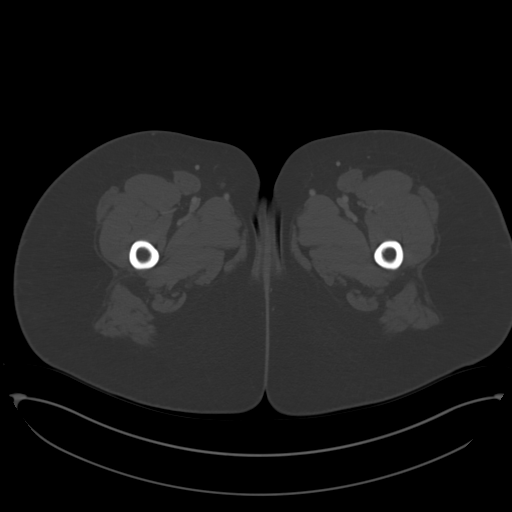
[im 12/93  soft-tissue]
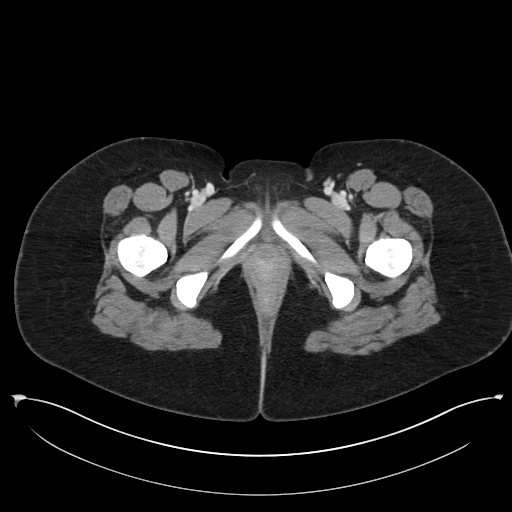
[im 20/93  soft-tissue]
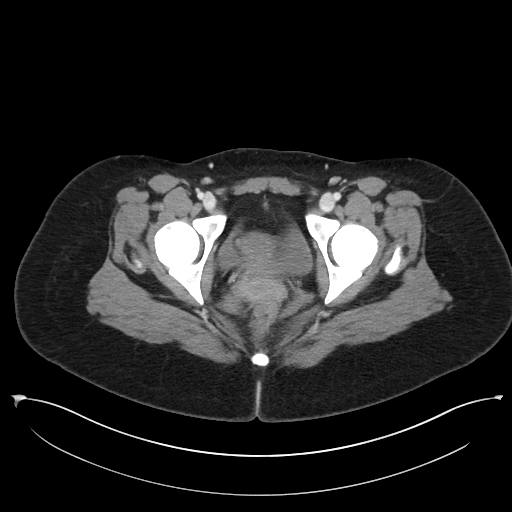
[im 27/93  soft-tissue]
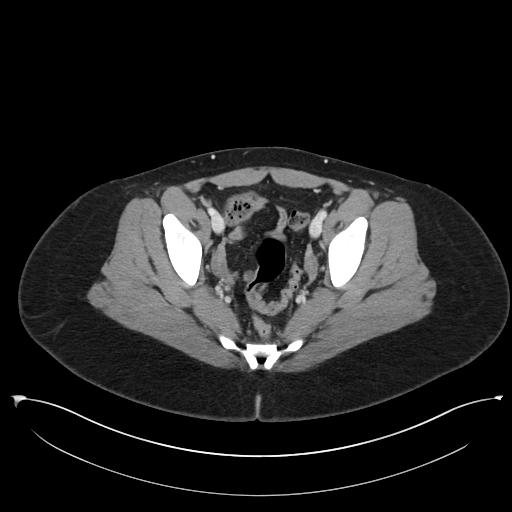
[im 31/93  soft-tissue]
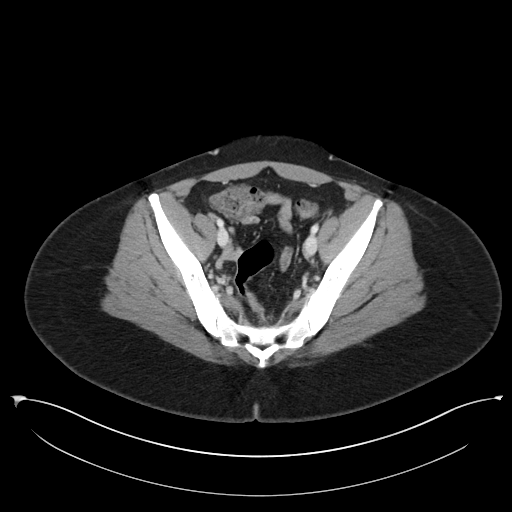
[im 39/93  soft-tissue]
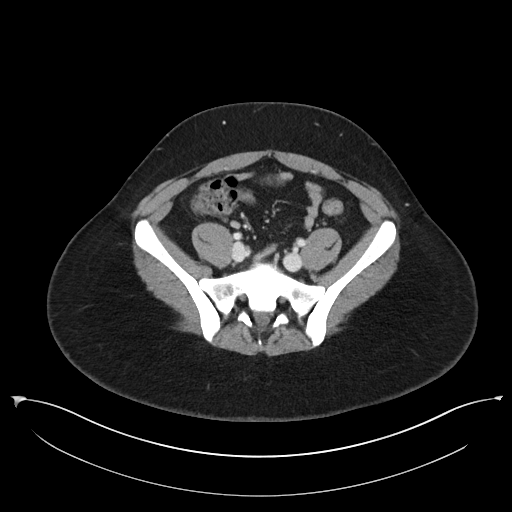
[im 47/93  soft-tissue]
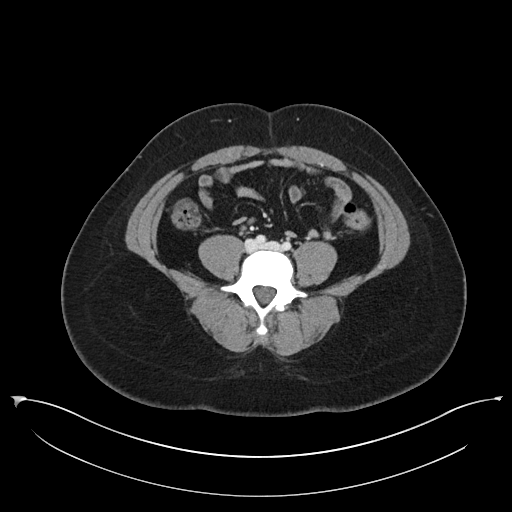
[im 54/93  soft-tissue]
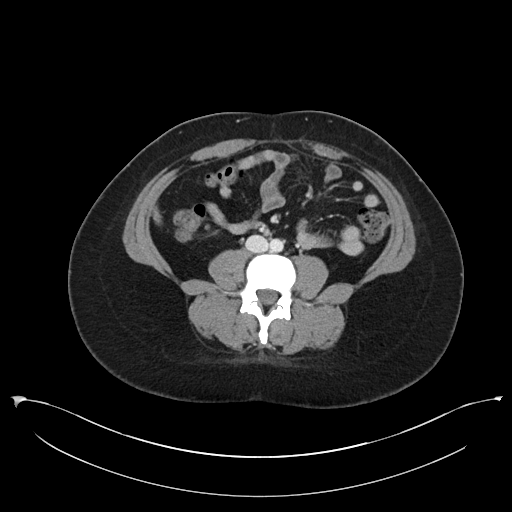
[im 62/93  soft-tissue]
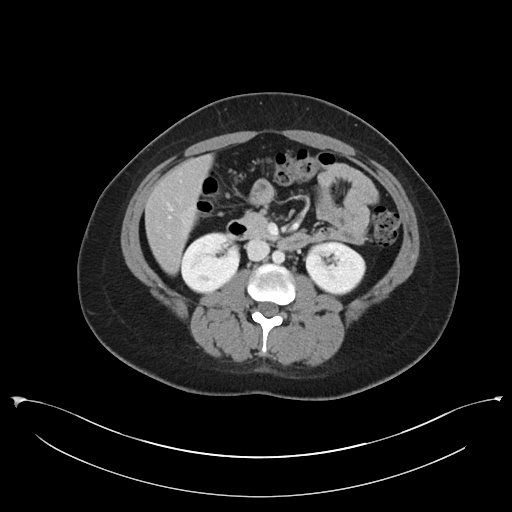
[im 62/93  bone]
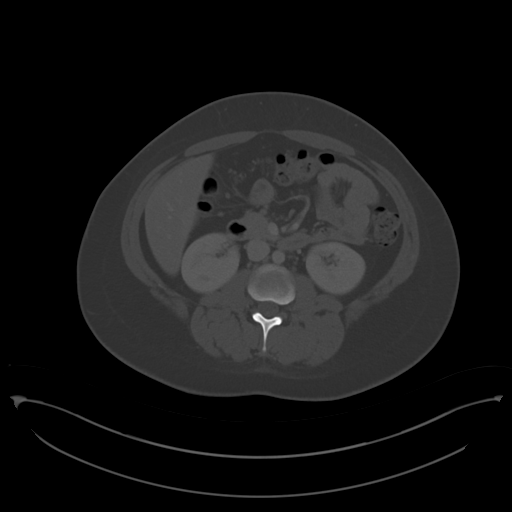
[im 66/93  soft-tissue]
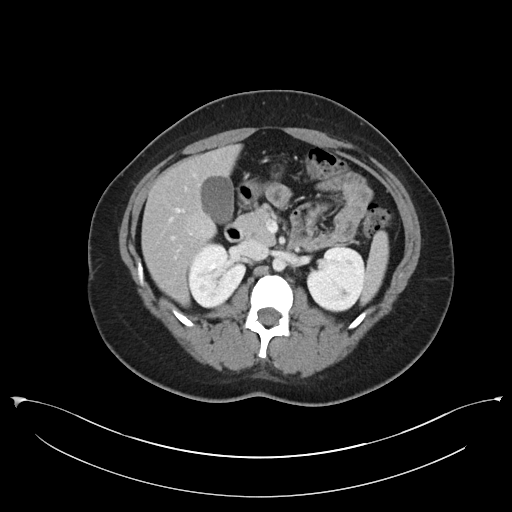
[im 73/93  soft-tissue]
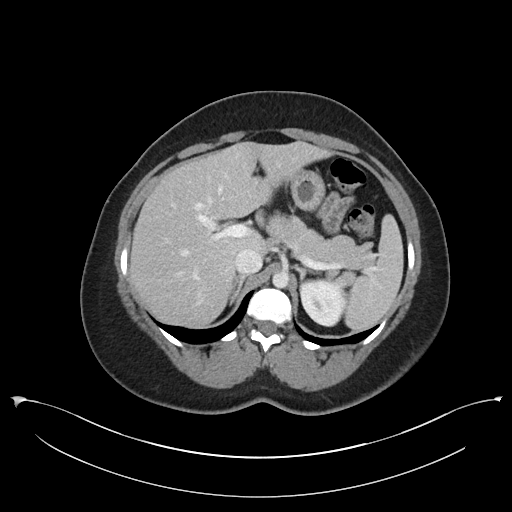
[im 81/93  soft-tissue]
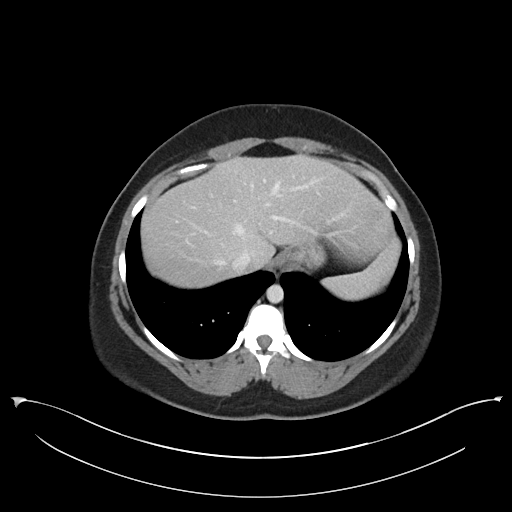
[im 89/93  soft-tissue]
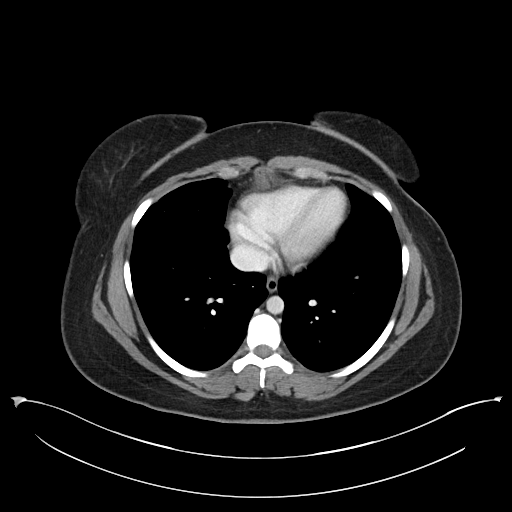

[Series 5: coronal · coronal · 0.87mm/px · 3 of 99 slices shown]
[im 33/99  soft-tissue]
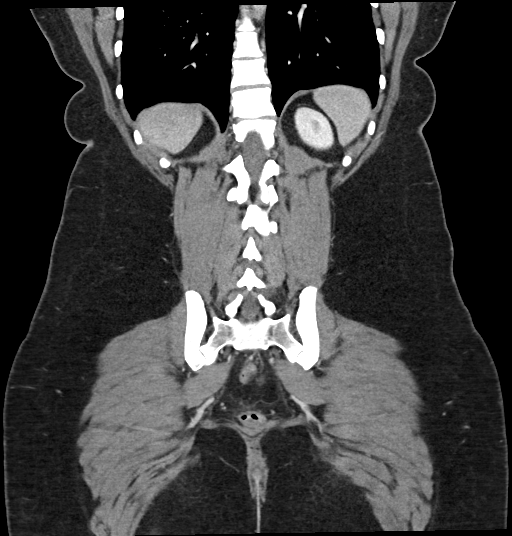
[im 44/99  soft-tissue]
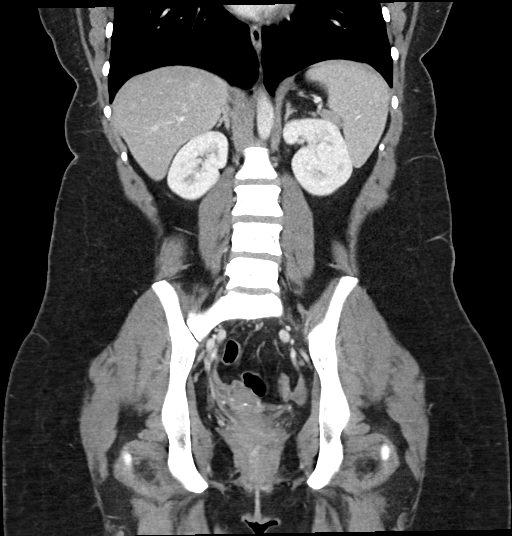
[im 55/99  soft-tissue]
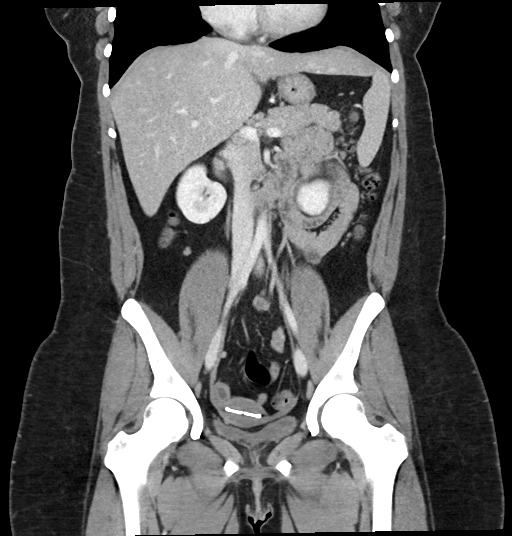

[16 of 46 positions shown; findings below may reference images not displayed]

RADIATION DOSE REDUCTION: This exam was performed according to the
departmental dose-optimization program which includes automated
exposure control, adjustment of the mA and/or kV according to
patient size and/or use of iterative reconstruction technique.

CONTRAST:  100mL OMNIPAQUE IOHEXOL 300 MG/ML  SOLN
FINDINGS: Lower chest: Minimal atelectasis or scarring in the right middle
lobe.

Hepatobiliary: A 1 cm hypervascular focus and regional
hypervascularity is noted in the anterior right lobe of the liver,
possible flash filling hemangioma and/or transient hepatic arterial
differences. The gallbladder is without stones. No biliary ductal
dilatation.

Pancreas: Unremarkable. No pancreatic ductal dilatation or
surrounding inflammatory changes.

Spleen: Normal in size without focal abnormality.

Adrenals/Urinary Tract: Adrenal glands are unremarkable. Kidneys are
normal, without renal calculi, focal lesion, or hydronephrosis.
Bladder is unremarkable.

Stomach/Bowel: Stomach is within normal limits. Appendix appears
normal. No evidence of bowel wall thickening, distention, or
inflammatory changes. No free air or pneumatosis.

Vascular/Lymphatic: No significant vascular findings are present. No
enlarged abdominal or pelvic lymph nodes.

Reproductive: IUD is present in the uterus.  No adnexal mass.

Other: No abdominopelvic ascites.

Musculoskeletal: No acute osseous abnormality.
IMPRESSION: No acute intra-abdominal process.  Normal appendix.

## 2022-12-13 ENCOUNTER — Other Ambulatory Visit (HOSPITAL_BASED_OUTPATIENT_CLINIC_OR_DEPARTMENT_OTHER): Payer: Self-pay

## 2022-12-14 ENCOUNTER — Encounter: Payer: Self-pay | Admitting: Family Medicine

## 2022-12-14 ENCOUNTER — Other Ambulatory Visit (HOSPITAL_BASED_OUTPATIENT_CLINIC_OR_DEPARTMENT_OTHER): Payer: Self-pay

## 2022-12-14 MED ORDER — LAMOTRIGINE 100 MG PO TABS
100.0000 mg | ORAL_TABLET | Freq: Every day | ORAL | 1 refills | Status: DC
  Filled 2022-12-14: qty 30, 30d supply, fill #0
  Filled 2023-01-29: qty 30, 30d supply, fill #1

## 2022-12-14 NOTE — Telephone Encounter (Signed)
Are you okay with refilling this Rx?

## 2023-03-13 ENCOUNTER — Other Ambulatory Visit (HOSPITAL_BASED_OUTPATIENT_CLINIC_OR_DEPARTMENT_OTHER): Payer: Self-pay

## 2023-03-13 MED ORDER — SERTRALINE HCL 25 MG PO TABS
25.0000 mg | ORAL_TABLET | Freq: Every day | ORAL | 1 refills | Status: AC
  Filled 2023-03-13: qty 30, 30d supply, fill #0
  Filled 2023-04-09: qty 30, 30d supply, fill #1

## 2023-03-13 MED ORDER — CLONAZEPAM 0.5 MG PO TABS
0.5000 mg | ORAL_TABLET | Freq: Every day | ORAL | 0 refills | Status: AC | PRN
  Filled 2023-03-13: qty 15, 15d supply, fill #0

## 2023-03-13 MED ORDER — LAMOTRIGINE 100 MG PO TABS
100.0000 mg | ORAL_TABLET | Freq: Every day | ORAL | 1 refills | Status: DC
  Filled 2023-03-13: qty 30, 30d supply, fill #0
  Filled 2023-04-09: qty 30, 30d supply, fill #1

## 2023-04-10 ENCOUNTER — Other Ambulatory Visit (HOSPITAL_BASED_OUTPATIENT_CLINIC_OR_DEPARTMENT_OTHER): Payer: Self-pay

## 2023-04-25 ENCOUNTER — Other Ambulatory Visit (HOSPITAL_BASED_OUTPATIENT_CLINIC_OR_DEPARTMENT_OTHER): Payer: Self-pay

## 2023-04-25 MED ORDER — SERTRALINE HCL 50 MG PO TABS
50.0000 mg | ORAL_TABLET | Freq: Every day | ORAL | 1 refills | Status: AC
  Filled 2023-04-25: qty 30, 30d supply, fill #0
  Filled 2023-07-17: qty 30, 30d supply, fill #1

## 2023-04-25 MED ORDER — LAMOTRIGINE 100 MG PO TABS
100.0000 mg | ORAL_TABLET | Freq: Every day | ORAL | 1 refills | Status: AC
  Filled 2023-04-25 – 2023-05-08 (×2): qty 30, 30d supply, fill #0
  Filled 2023-07-17: qty 30, 30d supply, fill #1

## 2023-04-25 MED ORDER — CLONAZEPAM 0.5 MG PO TABS
0.5000 mg | ORAL_TABLET | Freq: Every day | ORAL | 0 refills | Status: AC | PRN
  Filled 2023-04-25: qty 30, 15d supply, fill #0

## 2023-05-06 ENCOUNTER — Other Ambulatory Visit (HOSPITAL_BASED_OUTPATIENT_CLINIC_OR_DEPARTMENT_OTHER): Payer: Self-pay

## 2023-05-08 ENCOUNTER — Other Ambulatory Visit (HOSPITAL_BASED_OUTPATIENT_CLINIC_OR_DEPARTMENT_OTHER): Payer: Self-pay

## 2023-05-08 ENCOUNTER — Other Ambulatory Visit: Payer: Self-pay

## 2023-05-28 ENCOUNTER — Other Ambulatory Visit: Payer: Self-pay

## 2023-05-28 ENCOUNTER — Other Ambulatory Visit (HOSPITAL_BASED_OUTPATIENT_CLINIC_OR_DEPARTMENT_OTHER): Payer: Self-pay

## 2023-05-28 MED ORDER — LAMOTRIGINE 100 MG PO TABS
100.0000 mg | ORAL_TABLET | Freq: Every day | ORAL | 2 refills | Status: AC
  Filled 2023-05-28: qty 30, 30d supply, fill #0
  Filled 2023-08-29: qty 30, 30d supply, fill #1
  Filled 2023-11-14: qty 30, 30d supply, fill #2

## 2023-05-28 MED ORDER — SERTRALINE HCL 50 MG PO TABS
50.0000 mg | ORAL_TABLET | Freq: Every day | ORAL | 2 refills | Status: AC
  Filled 2023-05-28: qty 30, 30d supply, fill #0
  Filled 2023-08-29: qty 30, 30d supply, fill #1
  Filled 2023-11-14: qty 30, 30d supply, fill #2

## 2023-07-17 ENCOUNTER — Other Ambulatory Visit (HOSPITAL_BASED_OUTPATIENT_CLINIC_OR_DEPARTMENT_OTHER): Payer: Self-pay

## 2023-09-02 ENCOUNTER — Other Ambulatory Visit (HOSPITAL_BASED_OUTPATIENT_CLINIC_OR_DEPARTMENT_OTHER): Payer: Self-pay

## 2023-09-02 MED ORDER — LAMOTRIGINE 100 MG PO TABS
100.0000 mg | ORAL_TABLET | Freq: Every day | ORAL | 2 refills | Status: AC
  Filled 2023-09-02 – 2023-10-08 (×2): qty 30, 30d supply, fill #0

## 2023-09-02 MED ORDER — SERTRALINE HCL 50 MG PO TABS
50.0000 mg | ORAL_TABLET | Freq: Every day | ORAL | 2 refills | Status: AC
  Filled 2023-09-02 – 2023-10-08 (×2): qty 30, 30d supply, fill #0

## 2023-10-09 ENCOUNTER — Other Ambulatory Visit (HOSPITAL_BASED_OUTPATIENT_CLINIC_OR_DEPARTMENT_OTHER): Payer: Self-pay

## 2023-12-05 ENCOUNTER — Other Ambulatory Visit: Payer: Self-pay

## 2023-12-05 ENCOUNTER — Other Ambulatory Visit (HOSPITAL_BASED_OUTPATIENT_CLINIC_OR_DEPARTMENT_OTHER): Payer: Self-pay

## 2023-12-05 MED ORDER — SERTRALINE HCL 50 MG PO TABS
50.0000 mg | ORAL_TABLET | Freq: Every day | ORAL | 0 refills | Status: AC
  Filled 2023-12-05: qty 90, 90d supply, fill #0

## 2023-12-05 MED ORDER — LAMOTRIGINE 100 MG PO TABS
100.0000 mg | ORAL_TABLET | Freq: Every day | ORAL | 0 refills | Status: AC
  Filled 2023-12-05: qty 90, 90d supply, fill #0

## 2024-03-02 ENCOUNTER — Other Ambulatory Visit (HOSPITAL_BASED_OUTPATIENT_CLINIC_OR_DEPARTMENT_OTHER): Payer: Self-pay

## 2024-03-02 MED ORDER — SERTRALINE HCL 50 MG PO TABS
50.0000 mg | ORAL_TABLET | Freq: Every day | ORAL | 0 refills | Status: AC
  Filled 2024-03-02 – 2024-03-16 (×2): qty 90, 90d supply, fill #0

## 2024-03-02 MED ORDER — LAMOTRIGINE 100 MG PO TABS
100.0000 mg | ORAL_TABLET | Freq: Every day | ORAL | 0 refills | Status: AC
  Filled 2024-03-02 – 2024-03-16 (×2): qty 90, 90d supply, fill #0

## 2024-03-12 ENCOUNTER — Other Ambulatory Visit (HOSPITAL_BASED_OUTPATIENT_CLINIC_OR_DEPARTMENT_OTHER): Payer: Self-pay

## 2024-03-16 ENCOUNTER — Other Ambulatory Visit (HOSPITAL_BASED_OUTPATIENT_CLINIC_OR_DEPARTMENT_OTHER): Payer: Self-pay

## 2024-06-15 ENCOUNTER — Other Ambulatory Visit (HOSPITAL_BASED_OUTPATIENT_CLINIC_OR_DEPARTMENT_OTHER): Payer: Self-pay

## 2024-06-15 MED ORDER — SERTRALINE HCL 50 MG PO TABS
50.0000 mg | ORAL_TABLET | Freq: Every day | ORAL | 0 refills | Status: AC
  Filled 2024-06-15 – 2024-06-25 (×2): qty 90, 90d supply, fill #0

## 2024-06-15 MED ORDER — LAMOTRIGINE 100 MG PO TABS
100.0000 mg | ORAL_TABLET | Freq: Every day | ORAL | 0 refills | Status: AC
  Filled 2024-06-15 – 2024-06-25 (×2): qty 90, 90d supply, fill #0

## 2024-06-25 ENCOUNTER — Other Ambulatory Visit (HOSPITAL_BASED_OUTPATIENT_CLINIC_OR_DEPARTMENT_OTHER): Payer: Self-pay

## 2024-06-25 ENCOUNTER — Other Ambulatory Visit: Payer: Self-pay
